# Patient Record
Sex: Female | Born: 1965 | Race: Black or African American | Hispanic: No | Marital: Single | Smoking: Never smoker
Health system: Southern US, Community
[De-identification: ages and names within clinical notes are randomized; demographics above are authoritative.]

## PROBLEM LIST (undated history)

## (undated) DIAGNOSIS — M1990 Unspecified osteoarthritis, unspecified site: Secondary | ICD-10-CM

## (undated) DIAGNOSIS — I10 Essential (primary) hypertension: Secondary | ICD-10-CM

## (undated) DIAGNOSIS — E785 Hyperlipidemia, unspecified: Secondary | ICD-10-CM

## (undated) HISTORY — DX: Morbid (severe) obesity due to excess calories: E66.01

## (undated) HISTORY — PX: GASTRIC BYPASS: SHX52

## (undated) HISTORY — DX: Hyperlipidemia, unspecified: E78.5

## (undated) HISTORY — PX: CHOLECYSTECTOMY: SHX55

## (undated) HISTORY — DX: Essential (primary) hypertension: I10

## (undated) HISTORY — PX: TUBAL LIGATION: SHX77

## (undated) HISTORY — PX: TOTAL ABDOMINAL HYSTERECTOMY: SHX209

## (undated) HISTORY — PX: TOTAL KNEE ARTHROPLASTY: SHX125

## (undated) HISTORY — DX: Unspecified osteoarthritis, unspecified site: M19.90

---

## 2003-05-25 ENCOUNTER — Encounter: Payer: Self-pay | Admitting: Internal Medicine

## 2003-05-25 LAB — CONVERTED CEMR LAB

## 2005-06-08 LAB — HM MAMMOGRAPHY: HM Mammogram: NORMAL

## 2009-03-28 ENCOUNTER — Encounter: Payer: Self-pay | Admitting: Internal Medicine

## 2009-03-28 ENCOUNTER — Ambulatory Visit: Payer: Self-pay | Admitting: Internal Medicine

## 2009-03-28 DIAGNOSIS — K219 Gastro-esophageal reflux disease without esophagitis: Secondary | ICD-10-CM

## 2009-03-28 DIAGNOSIS — E785 Hyperlipidemia, unspecified: Secondary | ICD-10-CM | POA: Insufficient documentation

## 2009-03-28 DIAGNOSIS — M25519 Pain in unspecified shoulder: Secondary | ICD-10-CM

## 2009-03-28 DIAGNOSIS — R7309 Other abnormal glucose: Secondary | ICD-10-CM | POA: Insufficient documentation

## 2009-03-28 DIAGNOSIS — E1169 Type 2 diabetes mellitus with other specified complication: Secondary | ICD-10-CM | POA: Insufficient documentation

## 2009-03-28 DIAGNOSIS — I10 Essential (primary) hypertension: Secondary | ICD-10-CM | POA: Insufficient documentation

## 2009-03-28 LAB — CONVERTED CEMR LAB
AST: 19 units/L (ref 0–37)
Bilirubin, Direct: 0.1 mg/dL (ref 0.0–0.3)
Calcium: 9.8 mg/dL (ref 8.4–10.5)
Cholesterol: 375 mg/dL — ABNORMAL HIGH (ref 0–200)
Hgb A1c MFr Bld: 6.3 % — ABNORMAL HIGH (ref 4.6–6.1)
LDL Cholesterol: 257 mg/dL — ABNORMAL HIGH (ref 0–99)
Potassium: 4.2 meq/L (ref 3.5–5.3)
Sodium: 143 meq/L (ref 135–145)
TSH: 1.206 microintl units/mL (ref 0.350–4.500)
Total CHOL/HDL Ratio: 5

## 2009-04-15 ENCOUNTER — Telehealth: Payer: Self-pay | Admitting: Internal Medicine

## 2009-04-15 ENCOUNTER — Ambulatory Visit: Payer: Self-pay | Admitting: Internal Medicine

## 2009-04-15 LAB — CONVERTED CEMR LAB
Bilirubin Urine: NEGATIVE
Cholesterol, target level: 200 mg/dL
Glucose, Urine, Semiquant: NEGATIVE
Protein, U semiquant: >=300
Specific Gravity, Urine: 1.02
Urobilinogen, UA: 0.2
WBC Urine, dipstick: NEGATIVE

## 2009-04-29 ENCOUNTER — Encounter: Payer: Self-pay | Admitting: Internal Medicine

## 2009-05-12 ENCOUNTER — Telehealth: Payer: Self-pay | Admitting: Internal Medicine

## 2009-05-30 ENCOUNTER — Ambulatory Visit: Payer: Self-pay | Admitting: Internal Medicine

## 2009-05-30 DIAGNOSIS — L0233 Carbuncle of buttock: Secondary | ICD-10-CM | POA: Insufficient documentation

## 2009-05-30 DIAGNOSIS — M25569 Pain in unspecified knee: Secondary | ICD-10-CM

## 2009-05-30 LAB — CONVERTED CEMR LAB
Bilirubin Urine: NEGATIVE
Blood in Urine, dipstick: NEGATIVE
Protein, U semiquant: 30
Specific Gravity, Urine: 1.01
pH: 7

## 2009-05-31 ENCOUNTER — Encounter: Payer: Self-pay | Admitting: Internal Medicine

## 2009-06-09 ENCOUNTER — Ambulatory Visit: Payer: Self-pay | Admitting: Internal Medicine

## 2009-06-09 LAB — CONVERTED CEMR LAB
Alkaline Phosphatase: 80 units/L (ref 39–117)
CO2: 28 meq/L (ref 19–32)
Chloride: 104 meq/L (ref 96–112)
Creatinine, Ser: 1.02 mg/dL (ref 0.40–1.20)
HDL: 69 mg/dL (ref >39)
LDL Cholesterol: 182 mg/dL — ABNORMAL HIGH (ref 0–99)
Potassium: 4.1 meq/L (ref 3.5–5.3)
Total Bilirubin: 0.4 mg/dL (ref 0.3–1.2)
Triglycerides: 303 mg/dL — ABNORMAL HIGH (ref <150)
VLDL: 61 mg/dL — ABNORMAL HIGH (ref 0–40)

## 2009-06-21 ENCOUNTER — Ambulatory Visit: Payer: Self-pay | Admitting: Internal Medicine

## 2009-07-13 ENCOUNTER — Telehealth: Payer: Self-pay | Admitting: Internal Medicine

## 2009-08-10 ENCOUNTER — Ambulatory Visit (HOSPITAL_BASED_OUTPATIENT_CLINIC_OR_DEPARTMENT_OTHER): Admission: RE | Admit: 2009-08-10 | Discharge: 2009-08-10 | Payer: Self-pay | Admitting: Internal Medicine

## 2009-08-10 ENCOUNTER — Ambulatory Visit: Payer: Self-pay | Admitting: Internal Medicine

## 2009-08-10 ENCOUNTER — Telehealth: Payer: Self-pay | Admitting: Internal Medicine

## 2009-08-10 ENCOUNTER — Ambulatory Visit: Payer: Self-pay | Admitting: Diagnostic Radiology

## 2009-08-10 DIAGNOSIS — N6459 Other signs and symptoms in breast: Secondary | ICD-10-CM

## 2009-08-10 DIAGNOSIS — R109 Unspecified abdominal pain: Secondary | ICD-10-CM

## 2009-08-10 DIAGNOSIS — IMO0002 Reserved for concepts with insufficient information to code with codable children: Secondary | ICD-10-CM | POA: Insufficient documentation

## 2009-08-10 DIAGNOSIS — L989 Disorder of the skin and subcutaneous tissue, unspecified: Secondary | ICD-10-CM | POA: Insufficient documentation

## 2009-08-10 LAB — CONVERTED CEMR LAB
Bilirubin Urine: NEGATIVE
Glucose, Urine, Semiquant: NEGATIVE
Ketones, urine, test strip: NEGATIVE
Nitrite: NEGATIVE
Protein, U semiquant: 100
Specific Gravity, Urine: 1.02
Urobilinogen, UA: 0.2

## 2009-08-12 ENCOUNTER — Encounter: Payer: Self-pay | Admitting: Internal Medicine

## 2009-08-25 ENCOUNTER — Telehealth: Payer: Self-pay | Admitting: Internal Medicine

## 2009-08-29 ENCOUNTER — Encounter: Payer: Self-pay | Admitting: Internal Medicine

## 2009-09-14 ENCOUNTER — Telehealth: Payer: Self-pay | Admitting: Internal Medicine

## 2009-09-28 ENCOUNTER — Ambulatory Visit: Payer: Self-pay | Admitting: Internal Medicine

## 2009-09-28 DIAGNOSIS — R209 Unspecified disturbances of skin sensation: Secondary | ICD-10-CM | POA: Insufficient documentation

## 2009-09-30 ENCOUNTER — Encounter: Payer: Self-pay | Admitting: Internal Medicine

## 2009-10-24 ENCOUNTER — Ambulatory Visit: Payer: Self-pay | Admitting: Internal Medicine

## 2010-01-16 ENCOUNTER — Telehealth: Payer: Self-pay | Admitting: Internal Medicine

## 2010-01-19 ENCOUNTER — Ambulatory Visit: Payer: Self-pay | Admitting: Internal Medicine

## 2010-01-23 ENCOUNTER — Ambulatory Visit (HOSPITAL_BASED_OUTPATIENT_CLINIC_OR_DEPARTMENT_OTHER)
Admission: RE | Admit: 2010-01-23 | Discharge: 2010-01-23 | Payer: Self-pay | Source: Home / Self Care | Attending: Family Medicine | Admitting: Family Medicine

## 2010-02-22 ENCOUNTER — Ambulatory Visit: Admit: 2010-02-22 | Payer: Self-pay | Admitting: Internal Medicine

## 2010-03-14 NOTE — Progress Notes (Signed)
Summary: Medication Refill  Phone Note Refill Request Message from:  Fax from Pharmacy on May 12, 2009 8:22 AM  Refills Requested: Medication #1:  AMLODIPINE BESYLATE 5 MG TABS one by mouth once daily   Dosage confirmed as above?Dosage Confirmed   Brand Name Necessary? No   Supply Requested: 1 month   Last Refilled: 03/28/2009  Medication #2:  LISINOPRIL-HYDROCHLOROTHIAZIDE 20-12.5 MG TABS one by mouth once daily   Dosage confirmed as above?Dosage Confirmed   Brand Name Necessary? No   Supply Requested: 1 month   Last Refilled: 03/28/2009 [facility] [place]    Method Requested: Electronic Next Appointment Scheduled: 06-09-09 830 LAB  Initial call taken by: Roselle Locus,  May 12, 2009 8:26 AM  Follow-up for Phone Call        Rx completed in Dr. Tiajuana Amass Follow-up by: Glendell Docker CMA,  May 12, 2009 10:48 AM    Prescriptions: LISINOPRIL-HYDROCHLOROTHIAZIDE 20-12.5 MG TABS (LISINOPRIL-HYDROCHLOROTHIAZIDE) one by mouth once daily  #30 x 3   Entered by:   Glendell Docker CMA   Authorized by:   D. Thomos Lemons DO   Signed by:   Glendell Docker CMA on 05/12/2009   Method used:   Electronically to        [facility] [phone]* (retail)       [address]       [place], [place]  [address]       Ph: [phone]       Fax: [phone]   RxID:   [id number] AMLODIPINE BESYLATE 5 MG TABS (AMLODIPINE BESYLATE) one by mouth once daily  #30 x 3   Entered by:   Glendell Docker CMA   Authorized by:   D. Thomos Lemons DO   Signed by:   Glendell Docker CMA on 05/12/2009   Method used:   Electronically to        [facility] [phone]* (retail)       [address]       [place], [place]  [address]       Ph: [phone]       Fax: [phone]   RxID:   [id number]

## 2010-03-14 NOTE — Assessment & Plan Note (Signed)
Summary: poss UTI/dt   Vital Signs:  Patient profile:   45 year old [name] Height:      60 inches Weight:      313.50 pounds BMI:     61.45 O2 Sat:      96 % on Room air Temp:     97.7 degrees F oral Pulse rate:   77 / minute Pulse rhythm:   regular Resp:     20 per minute BP sitting:   140 / 100  (right arm) Cuff size:   Thigh  Vitals Entered By: Glendell Docker CMA (May 30, 2009 10:39 AM)  O2 Flow:  Room air CC: Rm 3-  Urinary discomfort & Knee pain Comments s   Primary Care Provider:  Dondra Spry DO  CC:  Rm 3-  Urinary discomfort & Knee pain.  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   Lungs:  normal respiratory effort, normal breath sounds, no crackles, and no wheezes.   Heart:  normal rate, regular rhythm, and no gallop.   Abdomen:  soft and non-tender.  no flank tenderness Extremities:  No lower extremity edema  Skin:  subcentimeter carbuncles - perineal area,  no drainage,  no redness   Impression & Recommendations:  Problem # 1:  CARBUNCLE, BUTTOCK (ICD-680.5) small carbuncles perineal area.  no abscess.  tx with doxycycline.   Patient advised to call office if symptoms persist or worsen.  Problem # 2:  KNEE PAIN (ICD-719.46) pt with intermittent knee pain.  probable DJD.  use tramadol for severe symptoms Her updated medication list for this problem includes:    Tramadol Hcl 50 Mg Tabs (Tramadol hcl) ..... One by mouth once daily as needed for knee pain  Complete Medication List: 1)  Amlodipine Besylate 5 Mg Tabs (Amlodipine besylate) .... One by mouth once daily 2)  Lisinopril-hydrochlorothiazide 20-12.5 Mg Tabs (Lisinopril-hydrochlorothiazide) .... One by mouth once daily 3)  Simvastatin 40 Mg Tabs (Simvastatin) .... One by mouth qpm 4)  Metformin Hcl 500 Mg Tabs (Metformin hcl) .... 1/2 by mouth two times a day x 1 week, then one by mouth two times a day 5)  Clotrimazole-betamethasone 1-0.05 % Crea (Clotrimazole-betamethasone) .... Apply two times a day x 1 week 6)  Doxycycline Hyclate 100 Mg Tabs (Doxycycline hyclate) .... One by mouth two times a day 7)  Tramadol Hcl 50 Mg Tabs (Tramadol hcl) .... One by mouth once daily as needed for knee pain  Other Orders: T-Culture, Urine ([id number]) UA Dipstick w/o Micro (manual) (25852) Specimen Handling (99000)  Patient Instructions: 1)  Keep your next follow up appointment 2)  Call our office if your urinary symptoms  do not  improve or gets worse. Prescriptions: TRAMADOL HCL 50 MG TABS (TRAMADOL HCL) one by mouth once daily as needed for knee pain  #30 x 0   Entered and Authorized by:   D. Thomos Lemons DO   Signed by:   D. Thomos Lemons DO on 05/30/2009   Method used:   Electronically to        [facility] [phone]* (retail)       [address]       [place], [place]  [address]       Ph: [phone]       Fax: [phone]   RxID:   [id number] DOXYCYCLINE HYCLATE 100 MG TABS (DOXYCYCLINE HYCLATE) one by mouth two times a day  #14 x 0   Entered and Authorized by:   D. Thomos Lemons DO   Signed by:   D. Thomos Lemons DO on 05/30/2009   Method used:    Electronically to        [facility] [phone]* (retail)       [address]       [place], [place]  [address]       Ph: [phone]       Fax: [phone]   RxID:   [id number] CLOTRIMAZOLE-BETAMETHASONE 1-0.05 % CREA (CLOTRIMAZOLE-BETAMETHASONE) apply two times a day x 1 week  #30 grams x 0   Entered and Authorized by:   D. Thomos Lemons DO   Signed by:   D. Thomos Lemons DO on 05/30/2009   Method used:   Electronically to        [facility] [phone]* (retail)       [address]       [place], [place]  [address]       Ph: [phone]       Fax: [phone]   RxID:   [id number]   Current Allergies (reviewed today): No known allergies   Laboratory Results   Urine Tests    Routine Urinalysis   Color: yellow Appearance: Clear Glucose: negative   (Normal Range: Negative) Bilirubin: negative   (Normal Range: Negative) Ketone: negative   (Normal Range: Negative) Spec. Gravity: 1.010   (Normal Range: 1.003-1.035) Blood: negative   (Normal Range: Negative) pH: 7.0   (Normal Range: 5.0-8.0) Protein: 30   (Normal Range: Negative) Urobilinogen: 0.2   (Normal Range: 0-1) Nitrite: negative   (Normal Range: Negative) Leukocyte Esterace: negative   (Normal Range: Negative)

## 2010-03-14 NOTE — Assessment & Plan Note (Signed)
Summary: 2 month follow up/mhf   Vital Signs:  Patient profile:   45 year old female Height:      60 inches Weight:      307.75 pounds BMI:     60.32 O2 Sat:      98 % Temp:     97.8 degrees F oral Pulse rate:   93 / minute Pulse rhythm:   regular Resp:     16 per minute BP sitting:   130 / 98  (left arm) Cuff size:   thigh  Vitals Entered By: Mervin Kung CMA (Jun 21, 2009 9:57 AM) CC: Room 2    2 Month follow up  Is Patient Diabetic? Yes   Primary Care Provider:  Dondra Spry DO  CC:  Room 2    2 Month follow up .  History of Present Illness: 45 y/o AA female c/o diarrhea. loose BMs since this Thursday.   4 BMs per day. stools can be watery no blood in stool  bumps in groin / legs getting worse no drainage  Htn - she did not take her bp meds this AM  morbid obesity - she has made signficant dietary changes.    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   Lungs:  normal respiratory effort, normal breath sounds, no crackles, and no wheezes.   Heart:  normal rate, regular rhythm, and no gallop.   Abdomen:  soft, non-tender, and normal bowel sounds.   Extremities:  No lower extremity edema  Skin:  no rashes,  faint patches on back of legs.  no boils Psych:  normally interactive and good eye contact.     Impression & Recommendations:  Problem # 1:  DIABETES MELLITUS, TYPE II, BORDERLINE  (ICD-790.29) I suspect loose stools from metformin.  change to Xr formulation.  pt also advised to take medication with food  The following medications were removed from the medication list:    Metformin Hcl 500 Mg Tabs (Metformin hcl) ..... One by mouth two times a day Her updated medication list for this problem includes:    Metformin Hcl 500 Mg Xr24h-tab (Metformin hcl) ..... One by mouth two times a day  Future Orders: T- Hemoglobin A1C ([id number]) ... 10/18/2009  Problem # 2:  HYPERTENSION (ICD-401.9) she did not take her meds this AM.  compliance encouraged.  Her updated medication list for this problem includes:    Amlodipine Besylate 5 Mg Tabs (Amlodipine besylate) ..... One by mouth once daily    Lisinopril-hydrochlorothiazide 20-12.5 Mg Tabs (Lisinopril-hydrochlorothiazide) ..... One by mouth once daily  Future Orders: T-Basic Metabolic Panel [id number]) ... 10/18/2009  BP today: 130/98 Prior BP: 140/100 (05/30/2009)  Prior 10 Yr Risk Heart Disease: 8 % (04/15/2009)  Labs Reviewed: K+: 4.1 (06/09/2009) Creat: : 1.02 (06/09/2009)   Chol: 312 (06/09/2009)   HDL: 69 (06/09/2009)   LDL: 182 (06/09/2009)   TG: 303 (06/09/2009)  Complete Medication List: 1)  Amlodipine Besylate 5 Mg Tabs (Amlodipine besylate) .... One by mouth once daily 2)  Lisinopril-hydrochlorothiazide 20-12.5 Mg Tabs (Lisinopril-hydrochlorothiazide) .... One by mouth once daily 3)  Simvastatin 40 Mg Tabs (Simvastatin) .... One by mouth qpm 4)  Clotrimazole-betamethasone 1-0.05 % Crea (Clotrimazole-betamethasone) .... Apply two times a day x 1 week 5)  Tramadol Hcl 50 Mg Tabs (Tramadol hcl) .... One by mouth once daily as needed for knee pain 6)  Metformin Hcl 500 Mg Xr24h-tab (Metformin hcl) .... One by mouth two times a day  Other Orders: Future Orders: T-Lipid Profile ([id number]) ... 10/18/2009  Patient Instructions: 1)  Please schedule a follow-up appointment in 4 months. 2)  BMP prior to  visit, ICD-9: 401.9 3)  AST, ALT Lipid Panel prior to visit, ICD-9: 272.4 4)  HbgA1C prior to visit, ICD-9: 790.29 5)  Please return for lab work one (1) week before your next appointment.  Prescriptions: SIMVASTATIN 40 MG TABS (SIMVASTATIN) one by mouth qpm  #30 x 5   Entered and Authorized by:   D. Thomos Lemons DO   Signed by:   D. Thomos Lemons DO on 06/21/2009   Method used:   Electronically to        [facility] [phone]* (retail)       [address]       [place], [place]  [address]       Ph: [phone]       Fax: [phone]   RxID:   [id number] LISINOPRIL-HYDROCHLOROTHIAZIDE 20-12.5 MG TABS (LISINOPRIL-HYDROCHLOROTHIAZIDE) one by mouth once daily  #30 x 5   Entered and Authorized by:   D. Thomos Lemons DO   Signed by:   D. Thomos Lemons DO on 06/21/2009   Method used:   Electronically to        [facility] [phone]* (retail)       [address]       [place], [place]  [address]       Ph: [phone]       Fax: [phone]   RxID:   [id number] AMLODIPINE BESYLATE 5 MG TABS (AMLODIPINE BESYLATE) one by mouth once daily  #30 x 5   Entered and Authorized by:   D. Thomos Lemons DO   Signed by:   D. Thomos Lemons DO on 06/21/2009   Method used:   Electronically to        [facility] [phone]* (retail)       [address]       [place], [place]  [address]       Ph: [phone]       Fax: [phone]   RxID:   [id number] METFORMIN HCL 500 MG XR24H-TAB (METFORMIN HCL) one by mouth two times a day  #60 x 5   Entered and Authorized by:   D. Thomos Lemons DO   Signed by:   D. Thomos Lemons DO on 06/21/2009   Method used:   Electronically to        [facility] [phone]* (retail)       [address]       [place], [place]  [address]       Ph: [phone]       Fax: [phone]   RxID:   [id number]   Current Allergies (reviewed today): No known allergies

## 2010-03-14 NOTE — Progress Notes (Signed)
Summary: Tramadol Refill  Phone Note Call from Patient Call back at Home Phone [phone]   Caller: Patient Summary of Call: patient called and left voice message requesting a refill on Tramadol. Initial call taken by: Glendell Docker CMA,  July 13, 2009 2:12 PM  Follow-up for Phone Call        call was returned to patient at [phone], patient states she is having pain in her legs and her knee. She states she has arthritis and it is being aggravated by the constant going up and down stairs in her home.  She states she has been laying down all day. She was informed that Dr Artist Pais is out of the office this afternoon and I would check with nurse practicioner, dependent upon what she decides, patient was informed she may have to wait until Dr Artist Pais returns in morning. Patient verbalized understanding and agrees. Follow-up by: Glendell Docker CMA,  July 13, 2009 2:16 PM  Additional Follow-up for Phone Call Additional follow up Details #1::        Refill sent to pharmacy. Additional Follow-up by: Lemont Fillers FNP,  July 13, 2009 2:22 PM    Additional Follow-up for Phone Call Additional follow up Details #2::    patient advised rx sent to pharmacy Follow-up by: Glendell Docker CMA,  July 13, 2009 2:35 PM  Prescriptions: TRAMADOL HCL 50 MG TABS (TRAMADOL HCL) one by mouth once daily as needed for knee pain  #30 x 0   Entered by:   Lemont Fillers FNP   Authorized by:   D. Thomos Lemons DO   Signed by:   Lemont Fillers FNP on 07/13/2009   Method used:   Electronically to        [facility] [phone]* (retail)       [address]       [place], [place]  [address]       Ph: [phone]       Fax: [phone]   RxID:   [id number]

## 2010-03-14 NOTE — Assessment & Plan Note (Signed)
Summary: New Patient   Vital Signs:  Patient profile:   45 year old female Height:      60 inches Weight:      315.75 pounds BMI:     61.89 O2 Sat:      99 % on Room air Temp:     98.1 degrees F oral Pulse rate:   92 / minute Pulse rhythm:   regular Resp:     18 per minute BP sitting:   160 / 100  Vitals Entered By: Glendell Docker CMA (March 28, 2009 9:46 AM)  O2 Flow:  Room air    Contraindications/Deferment of Procedures/Staging:    Test/Procedure: PAP Smear    Reason for deferment: hysterectomy   Primary Care Provider:  Dondra Spry DO  CC:  New Patient.  History of Present Illness: New patient  Blood pressure -out of blood pressure medication for the past  2months right arm, shoulder and neck pain for the past 3 weeks discuss wieght concerns and exercise-increase in wt after hysterectomy  htn - for five years.  Dr. Milus Banister prescribed BP meds (GYN).  last took BP meds 2 months ago.  when taking BP meds - no side effects when not taking BP meds - SBP can rise to 200.  assoc with chest pain and headache no hx of heart dz. never been hospitalized for chest pain.  hyperlipidemia - dx 5 yrs ago.  took lipitor in the past. no issues with statin  denies diabetes but told blood sugars are borderline  pt c/o right shoulder pain.  discomfort with lifting  right arm above shoulder.  onset 3 weeks.  no injury or trauma.    Preventive Screening-Counseling & Management  Alcohol-Tobacco     Alcohol drinks/day: 0     Smoking Status: never  Caffeine-Diet-Exercise     Caffeine use/day: none     Does Patient Exercise: no      Drug Use:  no.    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   Head:  normocephalic and atraumatic.   Eyes:  pupils equal, pupils round, and pupils reactive to light.   Ears:  R ear normal and L ear normal.   Mouth:  pharynx pink and moist.   Neck:  thick neck, supple and no masses.   Lungs:  normal respiratory effort, normal breath sounds, no crackles, and no wheezes.   Heart:  normal rate, regular rhythm, and no gallop.   Abdomen:  obese,  mild epigastric and RUQ tenderness,  unable to appreciate organomegaly or mass Msk:  right shoulder - AC joint tenderness,  limited abduction to 90 degrees Extremities:  trace left pedal edema and trace right pedal edema.   Neurologic:  cranial nerves II-XII intact and gait normal.     Impression & Recommendations:  Problem # 1:  HYPERTENSION (ICD-401.9)  Pt hasn't taken her BP meds x 2 months.  we discussed risk of untreated htn.  restart meds.  Pt will likely need 3-4 meds.  The following medications were removed from the medication list:    Hydrochlorothiazide 12.5 Mg Caps (Hydrochlorothiazide) .Marland Kitchen... Take 1 tablet by mouth every morning    Lisinopril 20 Mg Tabs (Lisinopril) .Marland Kitchen... Take 1 tablet by mouth once a day Her updated medication list for this problem includes:    Lopressor 50 Mg Tabs (Metoprolol tartrate) .Marland Kitchen... Take 1 tablet by mouth once a day    Amlodipine Besylate 5 Mg Tabs (Amlodipine besylate) ..... One by mouth once daily    Lisinopril-hydrochlorothiazide 20-12.5 Mg Tabs (Lisinopril-hydrochlorothiazide) ..... One by mouth once daily  Orders: T-Basic Metabolic Panel [id number]) T-TSH [id number])  The  following medications were removed from the medication list:    Hydrochlorothiazide 12.5 Mg Caps (Hydrochlorothiazide) .Marland Kitchen... Take 1 tablet by mouth every morning    Lisinopril 20 Mg Tabs (Lisinopril) .Marland Kitchen... Take 1 tablet by mouth once a day Her updated medication list for this problem includes:    Lopressor 50 Mg Tabs (Metoprolol tartrate) .Marland Kitchen... Take 1 tablet by mouth once a day    Amlodipine Besylate 5 Mg Tabs (Amlodipine besylate) ..... One by mouth once daily    Lisinopril-hydrochlorothiazide 20-12.5 Mg Tabs (Lisinopril-hydrochlorothiazide) ..... One by mouth once daily  BP today: 160/100  Problem # 2:  DIABETES MELLITUS, TYPE II, BORDERLINE (ICD-790.29) we discussed dietary restrictions.  check A1c  Orders: T- Hemoglobin A1C ([id number])  Problem # 3:  SHOULDER PAIN, RIGHT (ICD-719.41) right shoulder pain.  I suspect rotator cuff tendinitis.  Orders:  Orthopedic Referral (Ortho)  Problem # 4:  GERD (ICD-530.81) Assessment: Comment Only GERD exacerbated by nsaid use.  PPI for 6-8 wks.  Her updated medication list for this problem includes:    Omeprazole 20 Mg Cpdr (Omeprazole) ..... One by mouth once daily  Complete Medication List: 1)  Lopressor 50 Mg Tabs (Metoprolol tartrate) .... Take 1 tablet by mouth once a day 2)  Amlodipine Besylate 5 Mg Tabs (Amlodipine besylate) .... One by mouth once daily 3)  Lisinopril-hydrochlorothiazide 20-12.5 Mg Tabs (Lisinopril-hydrochlorothiazide) .... One by mouth once daily 4)  Omeprazole 20 Mg Cpdr (Omeprazole) .... One by mouth once daily  Other Orders: T-Hepatic Function [id number]) T-Lipid Profile [id number]) EKG w/ Interpretation ([address])  Patient Instructions: 1)  Please schedule a follow-up appointment in 2 weeks. 2)  Please follow low salt diet. 3)  Do not take over the counter advil, aleve, or motrin on a regular basis Prescriptions: OMEPRAZOLE 20 MG CPDR (OMEPRAZOLE) one by mouth once daily  #30 x 1   Entered and  Authorized by:   D. Thomos Lemons DO   Signed by:   D. Thomos Lemons DO on 03/28/2009   Method used:   Electronically to        [facility] [phone]* (retail)       [address]       [place], [place]  [address]       Ph: [phone]       Fax: [phone]   RxID:   [id number] AMLODIPINE BESYLATE 5 MG TABS (AMLODIPINE BESYLATE) one by mouth once daily  #30 x 0   Entered and Authorized by:   D. Thomos Lemons DO   Signed by:   D. Thomos Lemons DO on 03/28/2009   Method used:   Electronically to  [facility] [address] [phone]* (retail)       [address]       [place], [place]  [address]       Ph: [phone]       Fax: [phone]   RxID:   [id number] LISINOPRIL-HYDROCHLOROTHIAZIDE 20-12.5 MG TABS (LISINOPRIL-HYDROCHLOROTHIAZIDE) one by mouth once daily  #30 x 0   Entered and Authorized by:   D. Thomos Lemons DO   Signed by:   D. Thomos Lemons DO on 03/28/2009   Method used:   Electronically to        [facility] [phone]* (retail)       [address]       [place], [place]  [address]       Ph: [phone]       Fax: [phone]   RxID:   [id number]    Preventive Care Screening  Mammogram:    Date:  06/05/2005    Results:  normal   Pap Smear:    Date:  05/25/2003    Results:  Hysterectomy    Current Allergies (reviewed today): No known allergies

## 2010-03-14 NOTE — Assessment & Plan Note (Signed)
Summary: 1 month follow up/mhf   Vital Signs:  Patient profile:   45 year old female Weight:      312 pounds BMI:     61.15 O2 Sat:      99 % on Room air Temp:     98.0 degrees F oral Pulse rate:   84 / minute Pulse rhythm:   regular BP sitting:   154 / 110  (left arm) Cuff size:   Thigh  Vitals Entered By: Glendell Docker CMA (September 28, 2009 11:40 AM)  O2 Flow:  Room air CC: 1 Month Follow up  Pain Assessment Patient in pain? yes     Location: knee Intensity: 10 Type: stinging Onset of pain  With activity   Primary Care Provider:  Dondra Spry DO  CC:  1 Month Follow up .  History of Present Illness:  45 y/o AA female for f/u after prev visit - pt followed up with her GYN she was tx'ed with abx.  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   Lungs:  normal respiratory effort and normal breath sounds.   Heart:  normal rate, regular rhythm, and no gallop.   Extremities:  trace left pedal edema and trace right pedal edema.     Impression & Recommendations:  Problem # 1:  DYSPAREUNIA (ICD-625.0) Assessment Improved pt reports seeing the GYN pain attributed to bladder   infection and or vaginitis she was treated with flagyl  Problem # 2:  NIPPLE DISCHARGE (ICD-611.79) Assessment: Improved  resolved.   pt seen by her GYN - Dr.  Annye Rusk she reports breast exam normal I advise mammogram  Orders: Mammogram (Screening) (Mammo)  Problem # 3:  HYPERTENSION (ICD-401.9) Assessment: Deteriorated I urged compliance with BP meds.    The following medications were removed from the medication list:    Amlodipine Besylate 5 Mg Tabs (Amlodipine besylate) ..... One by mouth once daily Her updated medication list for this problem includes:    Lisinopril-hydrochlorothiazide 20-12.5 Mg Tabs (Lisinopril-hydrochlorothiazide) ..... One by mouth once daily  Future Orders: T-Basic Metabolic Panel [id number]) ... 11/21/2009  BP today: 154/110 Prior BP: 154/110 (08/10/2009)  Prior 10 Yr Risk Heart Disease: 8 % (04/15/2009)  Labs Reviewed: K+: 4.1 (06/09/2009) Creat: : 1.02 (06/09/2009)   Chol: 312 (06/09/2009)   HDL: 69 (06/09/2009)   LDL: 182 (06/09/2009)   TG: 303 (06/09/2009)  Problem # 4:  KNEE PAIN (ICD-719.46) Assessment: Unchanged Chronic OA of knees.  use tramadol as needed.  I encouraged wt loss  Her updated medication list for this problem includes:    Tramadol Hcl 50 Mg Tabs (  Tramadol hcl) ..... One by mouth once daily as needed for knee pain  Complete Medication List: 1)  Lisinopril-hydrochlorothiazide 20-12.5 Mg Tabs (Lisinopril-hydrochlorothiazide) .... One by mouth once daily 2)  Pravastatin Sodium 40 Mg Tabs (Pravastatin sodium) .... One by mouth once daily 3)  Tramadol Hcl 50 Mg Tabs (Tramadol hcl) .... One by mouth once daily as needed for knee pain 4)  Metformin Hcl 500 Mg Xr24h-tab (Metformin hcl) .... One by mouth two times a day  Other Orders: Future Orders: T-Hepatic Function [id number]) ... 11/21/2009 T-Lipid Profile [phone]) ... 11/21/2009 T- Hemoglobin A1C ([id number]) ... 11/21/2009 T-Syphilis Test (RPR)  [phone]) ... 11/21/2009  Patient Instructions: 1)  Please schedule a follow-up appointment in 2 months. 2)  BMP prior to visit, ICD-9:  401.9 3)  Hepatic Panel prior to visit, ICD-9: 272.4 4)  Lipid Panel prior to visit, ICD-9: 272.4 5)  HbgA1C prior to visit, ICD-9: 790.29 6)  RPR:  782.0 7)  Please return for lab work one (1) week before your next appointment.  Prescriptions: PRAVASTATIN SODIUM 40 MG TABS (PRAVASTATIN SODIUM) one by mouth once daily  #30 x 5   Entered and Authorized by:   D. Thomos Lemons DO   Signed by:   D. Thomos Lemons DO on 09/28/2009   Method used:   Electronically to        [facility] [phone]* (retail)       [address]       [place], [place]  [address]       Ph: [phone]       Fax: [phone]   RxID:   [id number]

## 2010-03-14 NOTE — Letter (Signed)
Summary: Optometrist Release   Imported By: Lanelle Bal 08/16/2009 13:57:26  _____________________________________________________________________  External Attachment:    Type:   Image     Comment:   External Document

## 2010-03-14 NOTE — Assessment & Plan Note (Signed)
Summary: pain in lower abd and back pain/mhf   Vital Signs:  Patient profile:   45 year old female Weight:      311 pounds BMI:     60.96 O2 Sat:      98 % on Room air Temp:     98.0 degrees F oral Pulse rate:   87 / minute Pulse rhythm:   regular Resp:     18 per minute BP sitting:   154 / 110  (right arm) Cuff size:   Thigh  Vitals Entered By: Glendell Docker CMA (August 10, 2009 10:15 AM)  O2 Flow:  Room air CC: Rm 3- Back Pain Is Patient Diabetic? No Pain Assessment Patient in pain? yes     Location: lower back Intensity: 9 Type: aching Onset of pain  With activity Comments medications reviewed no longer using the Clotrimzaole cream, c/o lower back pain, labial irritation onset two days ago, no self care measures taken, she also states that she is having ovary pain- sharp pain with intercourse, does not have a regular gyn    Primary Care Provider:  DThomos Lemons DO  CC:  Rm 3- Back Pain.  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   Neck:  supple and no masses.   Lungs:  normal respiratory effort and normal breath sounds.   Heart:  normal rate,  regular rhythm, and no gallop.   Abdomen:  obese.  mild RLQ tenderness,  soft, no guarding, no rigidity, and no rebound tenderness.   Extremities:  trace left pedal edema and trace right pedal edema.   Neurologic:  cranial nerves II-XII intact and gait normal.   Skin:  4-5  2mm papular lesion on bilateral nipples   Impression & Recommendations:  Problem # 1:  PELVIC  PAIN (ICD-789.09) RLQ pain.  rule out kidney stone.  check CT.  use tramadol as directed.  UA is positive.  empiric cipro  Her updated medication list for this problem includes:    Tramadol Hcl 50 Mg Tabs (Tramadol hcl) ..... One by mouth once daily as needed for knee pain  Orders: CT without Contrast (CT w/o contrast)  Problem # 2:  NIPPLE DISCHARGE (ICD-611.79) pt advised to f/u with GYN  Orders: Mammogram (Screening) (Mammo)  Problem # 3:  SKIN LESION (ICD-709.9)  Pt with papillar lesion on nipples bilaterally.   question  molluscum.  refer to Derm for further eval and tx  Orders: Dermatology Referral (Derma)  Problem # 4:  DYSPAREUNIA (ICD-625.0)  Orders: Gynecologic Referral (Gyn)  Complete Medication List: 1)  Amlodipine Besylate 5 Mg Tabs (Amlodipine besylate) .... One by mouth once daily 2)  Lisinopril-hydrochlorothiazide 20-12.5 Mg Tabs (Lisinopril-hydrochlorothiazide) .... One by mouth once daily 3)  Simvastatin 40 Mg Tabs (Simvastatin) .... One by mouth qpm 4)  Tramadol Hcl 50 Mg Tabs (Tramadol hcl) .... One by mouth once daily as needed for knee pain 5)  Metformin Hcl 500 Mg Xr24h-tab (Metformin hcl) .... One by mouth two times a day 6)  Ciprofloxacin Hcl 500 Mg Tabs (Ciprofloxacin hcl) .... One by mouth two times a day  Other Orders: UA Dipstick w/o Micro (manual) ([id number]) T-Culture, Urine ([id number]) Specimen Handling ([address]000)  Patient Instructions: 1)  Please schedule a follow-up appointment in 2 weeks. 2)  Call our office if your symptoms do not  improve or gets  worse. Prescriptions: CIPROFLOXACIN HCL 500 MG TABS (CIPROFLOXACIN HCL) one by mouth two times a day  #14 x 0   Entered and Authorized by:   D. Thomos Lemons DO   Signed by:   D. Thomos Lemons DO on 08/10/2009   Method used:   Electronically to        [facility] [phone]* (retail)       [address]       [place], [place]  [address]       Ph: [phone]       Fax: [phone]   RxID:   [id number]   Current Allergies (reviewed today): No known allergies   Laboratory Results   Urine Tests    Routine Urinalysis   Color: yellow Appearance: Cloudy Glucose: negative   (Normal Range: Negative) Bilirubin: negative   (Normal Range: Negative) Ketone: negative   (Normal Range: Negative) Spec. Gravity: 1.020   (Normal Range: 1.003-1.035) Blood: trace-intact   (Normal Range: Negative) pH: 7.0   (Normal Range: 5.0-8.0) Protein: 100   (Normal Range: Negative) Urobilinogen: 0.2   (Normal Range: 0-1) Nitrite: negative   (Normal Range: Negative) Leukocyte Esterace: large   (Normal Range: Negative)

## 2010-03-14 NOTE — Assessment & Plan Note (Signed)
Summary: FOLLOW UP/MHF   Vital Signs:  Patient profile:   45 year old female Weight:      315 pounds BMI:     61.74 O2 Sat:      99 % on Room air Temp:     97.9 degrees F oral Pulse rate:   94 / minute Pulse rhythm:   regular Resp:     20 per minute BP sitting:   124 / 60  (right arm) Cuff size:   Thigh Cuff  Vitals Entered By: Glendell Docker CMA (April 15, 2009 1:29 PM)  O2 Flow:  Room air CC: Lipid Management   Primary Care Provider:  Dondra Spry DO  CC:  Lipid Management.  History of Present Illness:  Hypertension Follow-Up      This is a 45 year old woman who presents for Hypertension follow-up.  The patient reports urinary frequency, but denies lightheadedness.  The patient denies the following associated symptoms: chest pain.  Compliance with medications (by patient report) has been near 100%.  The patient reports that dietary compliance has been fair.    we reviewed labs - A1c elevated  Lipids elevated  Lipid Management History:      Positive NCEP/ATP III risk factors include diabetes and hypertension.  Negative NCEP/ATP III risk factors include female age less than 16 years old, HDL cholesterol greater than 60, no family history for ischemic heart disease, non-tobacco-user status, no ASHD (atherosclerotic heart disease), no prior stroke/TIA, no peripheral vascular disease, and no history of aortic aneurysm.     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    Physical  Exam  General:  alert and overweight-appearing.   Neck:  supple and no masses.   Lungs:  normal respiratory effort, normal breath sounds, no crackles, and no wheezes.   Heart:  normal rate, regular rhythm, and no gallop.   Extremities:  trace left pedal edema and trace right pedal edema.   Neurologic:  cranial nerves II-XII intact and gait normal.   Psych:  normally interactive and good eye contact.     Impression & Recommendations:  Problem # 1:  DIABETES MELLITUS, TYPE II, BORDERLINE (ICD-790.29)  Discussed importance of life style changes.  refer to nutrionist.   start metformin.  Her updated medication list for this problem includes:    Metformin Hcl 500 Mg Tabs (Metformin hcl) .Marland Kitchen... 1/2 by mouth two times a day x 1 week, then one by mouth two times a day  Labs Reviewed: Creat: 1.12 (03/28/2009)     Orders: Nutrition Referral (Nutrition)  Problem # 2:  SHOULDER PAIN, RIGHT (ICD-719.41) Pt referred to [facility].  awaiting scheduling  Problem # 3:  HYPERTENSION (ICD-401.9) significantly improved.  change metoprolol to succinate  Her updated medication list for this problem includes:    Metoprolol Succinate 50 Mg Xr24h-tab (Metoprolol succinate) ..... One by mouth once daily    Amlodipine Besylate 5 Mg Tabs (Amlodipine besylate) ..... One by mouth once daily    Lisinopril-hydrochlorothiazide 20-12.5 Mg Tabs (Lisinopril-hydrochlorothiazide) .Marland KitchenMarland KitchenMarland KitchenMarland Kitchen  One by mouth once daily  BP today: 124/60 Prior BP: 160/100 (03/28/2009)  Labs Reviewed: K+: 4.2 (03/28/2009) Creat: : 1.12 (03/28/2009)   Chol: 375 (03/28/2009)   HDL: 75 (03/28/2009)   LDL: 257 (03/28/2009)   TG: 216 (03/28/2009)  Problem # 4:  HYPERLIPIDEMIA (ICD-272.4) Goal LDL < 100.  dietary changes reviewed.  start statin.  Her updated medication list for this problem includes:    Simvastatin 40 Mg Tabs (Simvastatin) ..... One by mouth qpm  Labs Reviewed: SGOT: 19 (03/28/2009)   SGPT: 21 (03/28/2009)   HDL:75  (03/28/2009)  LDL:257 (03/28/2009)  Chol:375 (03/28/2009)  Trig:216 (03/28/2009)  Complete Medication List: 1)  Metoprolol Succinate 50 Mg Xr24h-tab (Metoprolol succinate) .... One by mouth once daily 2)  Amlodipine Besylate 5 Mg Tabs (Amlodipine besylate) .... One by mouth once daily 3)  Lisinopril-hydrochlorothiazide 20-12.5 Mg Tabs (Lisinopril-hydrochlorothiazide) .... One by mouth once daily 4)  Omeprazole 20 Mg Cpdr (Omeprazole) .... One by mouth once daily 5)  Simvastatin 40 Mg Tabs (Simvastatin) .... One by mouth qpm 6)  Metformin Hcl 500 Mg Tabs (Metformin hcl) .... 1/2 by mouth two times a day x 1 week, then one by mouth two times a day  Other Orders: UA Dipstick w/o Micro (manual) ([id number])  Lipid Assessment/Plan:      Based on NCEP/ATP III, the patient's risk factor category is "history of diabetes".  The patient's lipid goals are as follows: Total cholesterol goal is 200; LDL cholesterol goal is 100; HDL cholesterol goal is 40; Triglyceride goal is 150.    Patient Instructions: 1)  Please schedule a follow-up appointment in 2 months. 2)  BMP prior to visit, ICD-9:  401.9 3)  Hepatic Panel prior to visit, ICD-9: 272.4 4)  Lipid Panel prior to visit, ICD-9: 272.4 5)  HbgA1C prior to visit, ICD-9: 790.29 6)  [email or web address] 7)  Please return for lab work one (1) week before your next appointment.  Prescriptions: METFORMIN HCL 500 MG TABS (METFORMIN HCL) 1/2 by mouth two times a day x 1 week, then one by mouth two times a day  #60 x 2   Entered and Authorized by:   D. Thomos Lemons DO   Signed by:   D. Thomos Lemons DO on 04/15/2009   Method used:   Electronically to        [facility] [phone]* (retail)       [address]       [place], [place]  [address]       Ph: [phone]       Fax: [phone]   RxID:   [id number] SIMVASTATIN 40 MG TABS (SIMVASTATIN) one by mouth qpm  #30 x 2   Entered and Authorized by:   D. Thomos Lemons DO   Signed by:    D. Thomos Lemons DO on 04/15/2009   Method used:   Electronically to        [facility] [phone]* (retail)       [address]       [place], [place]  [address]       Ph: [phone]       Fax: [phone]   RxID:   [id number] METOPROLOL SUCCINATE 50 MG XR24H-TAB (METOPROLOL SUCCINATE) one by mouth once daily  #30 x 5   Entered and Authorized by:   D. Thomos Lemons DO   Signed by:   D. Thomos Lemons DO on 04/15/2009   Method  used:   Electronically to        [facility] [phone]* (retail)       [address]       [place], [place]  [address]       Ph: [phone]       Fax: [phone]   RxID:   [id number]   Current Allergies (reviewed today): No known allergies   Laboratory Results   Urine Tests    Routine Urinalysis   Color: yellow Appearance: Clear Glucose: negative   (Normal Range: Negative) Bilirubin: negative   (Normal Range: Negative) Ketone: negative   (Normal Range: Negative) Spec. Gravity: 1.020   (Normal Range: 1.003-1.035) Blood: negative   (Normal Range: Negative) pH: 6.5   (Normal Range: 5.0-8.0) Protein: >=300   (Normal Range: Negative) Urobilinogen: 0.2   (Normal Range: 0-1) Nitrite: negative   (Normal Range: Negative) Leukocyte Esterace: negative   (Normal Range: Negative)

## 2010-03-14 NOTE — Progress Notes (Signed)
Summary: Tramadol Refill  Phone Note Refill Request Call back at Home Phone [phone] Message from:  Patient on September 14, 2009 2:45 PM  Refills Requested: Medication #1:  TRAMADOL HCL 50 MG TABS one by mouth once daily as needed for knee pain Pt is out of refills  Initial call taken by: Lannette Donath,  September 14, 2009 2:45 PM  Follow-up for Phone Call        call returned to patient at [phone], bad connection and call was dropped. Patient was call back, no answer, voice message reached. Patient was advised office visit is required for refill on pain medication. Message left for patient to schedule a follow up  Follow-up by: Glendell Docker CMA,  September 14, 2009 3:04 PM

## 2010-03-14 NOTE — Consult Note (Signed)
Summary: [facility]   Imported By: Lanelle Bal 10/14/2009 10:39:32  _____________________________________________________________________  External Attachment:    Type:   Image     Comment:   External Document

## 2010-03-14 NOTE — Letter (Signed)
Summary: [facility]  [facility]   Imported By: Maryln Gottron 09/20/2009 14:23:33  _____________________________________________________________________  External Attachment:    Type:   Image     Comment:   External Document

## 2010-03-14 NOTE — Progress Notes (Signed)
Summary: needs a call back while at pharmacy  Phone Note Call from Patient   Caller: Patient Summary of Call: omeprazole, lisinopril HCP-HCTZ, amlodipine, is what pt. is use to taking & states she at the pharmacy picking this up now..... Patient needs a call back now while she is at the pharmacy because there is another med that was called in for her that she states she normally does not take, so she needs to know if she is to get this and take it?              call [phone] as soon as possible Initial call taken by: Michaelle Copas,  April 15, 2009 3:52 PM  Follow-up for Phone Call        call was returned to patient she states the pharmacy gave her 2 rx's and she was only to have had one rx.  She was advised to check with pharmacy. Clarification of Metformin was provided to patient. She state she will check with pharmacy Follow-up by: Glendell Docker CMA,  April 15, 2009 4:48 PM

## 2010-03-14 NOTE — Letter (Signed)
Summary: [facility]  [facility]   Imported By: Lanelle Bal 10/14/2009 09:23:50  _____________________________________________________________________  External Attachment:    Type:   Image     Comment:   External Document

## 2010-03-14 NOTE — Consult Note (Signed)
[facility]  [facility]   Imported By: Lanelle Bal 05/06/2009 09:57:18  _____________________________________________________________________  External Attachment:    Type:   Image     Comment:   External Document

## 2010-03-14 NOTE — Progress Notes (Signed)
Summary: GYN Status  ---- Converted from flag ---- ---- 08/24/2009 11:13 PM, D. Thomos Lemons DO wrote: call pt - has she been seen by GYN? ------------------------------  Phone Note Outgoing Call   Call placed by: Glendell Docker CMA,  August 25, 2009 8:07 AM Call placed to: Patient Summary of Call: call placed to patient at [phone], no answer. A voice message was left for patient to return call. Need to find out if patient was seen by GYN and if so when. Initial call taken by: Glendell Docker CMA,  August 25, 2009 8:08 AM  Follow-up for Phone Call        call was returned by patient, she states that she was seen by a GYN in [facility] on June 14th, but she could not recall the name at time of converstation Follow-up by: Glendell Docker CMA,  August 25, 2009 8:11 AM

## 2010-03-14 NOTE — Progress Notes (Signed)
Summary: Tramadol Refill  Phone Note Call from Patient Call back at [phone]   Caller: Patient Call For: D. Thomos Lemons DO Summary of Call: patient called and left voice message requesting a rx refill on Tramadol Initial call taken by: Glendell Docker CMA,  January 16, 2010 3:37 PM  Follow-up for Phone Call        call returned to patient regarding Tramadol refill, patient states that she is  having pain in her legs and pain with walking while she is on her job Follow-up by: Glendell Docker CMA,  January 16, 2010 4:15 PM  Additional Follow-up for Phone Call Additional follow up Details #1::        I suggest referral to ortho - see orders Additional Follow-up by: D. Thomos Lemons DO,  January 16, 2010 4:58 PM    Additional Follow-up for Phone Call Additional follow up Details #2::    call placed to patient, spoke with patients daughter Faith [name], message was left for patient to return call regarding refill and referral. Follow-up by: Glendell Docker CMA,  January 16, 2010 5:09 PM  Additional Follow-up for Phone Call Additional follow up Details #3:: Details for Additional Follow-up Action Taken: Call placed to pt. Left message with pt's daughter, Faith [name] to have pt return my call Faith [name] CMA Duncan Dull)  January 17, 2010 10:55 AM     no return call from patient Glendell Docker [facility]  January 18, 2010 8:30 AM

## 2010-03-14 NOTE — Progress Notes (Signed)
Summary: Ct Results  Phone Note Outgoing Call   Summary of Call: call pt - CT of abd and pelvis negative for acute findings Initial call taken by: D. Thomos Lemons DO,  August 10, 2009 4:42 PM  Follow-up for Phone Call        attempted to contact patient at [phone], no answer,detailed voice message left informing patient per Dr Artist Pais instructions Follow-up by: Glendell Docker CMA,  August 11, 2009 8:03 AM

## 2010-03-16 NOTE — Assessment & Plan Note (Signed)
Summary: KNEE PAIN/NP/LP   Vital Signs:  Patient profile:   45 year old female Height:      60 inches (152.40 cm) Weight:      307.4 pounds (139.73 kg) BMI:     60.25 Temp:     97.8 degrees F (36.56 degrees C) oral Pulse rate:   85 / minute BP sitting:   199 / 128  (right arm)  Vitals Entered By: Baxter Hire) (January 23, 2010 3:09 PM) CC: knee pain Pain Assessment Patient in pain? yes     Location: both knees Intensity: 10+ Type: aching/throbbing Onset of pain  Constant Nutritional Status BMI of > 30 = obese  Does patient need assistance? Functional Status Self care Ambulation Normal   Primary Care Provider:  DThomos Lemons DO  CC:  knee pain.  History of Present Illness: 45 yo F here with bilateral knee pain  Patient denies any known injury Has had several year history of L > R knee pain worse with ambulation Pain now even bad when sitting, soaking in tub, otherwise at rest. Remotely had x-rays showing 'bone on bone' arthritis. Has also had injections (cortisone and visco) which helped initially but when had them most recently > 1 year ago only lasted about a week. Tried aleve, tylenol, ibuprofen without help Icing and heat at bedtime. Taking tramadol now for pain Some catching and does give out because of pain No prior knee surgeries  Just took her blood pressure medication prior to coming into our office - no chest pain, dyspnea, other complaints.  Habits & Providers  Alcohol-Tobacco-Diet     Alcohol drinks/day: 0     Tobacco Status: never  Problems Prior to Update: 1)  Paresthesia  (ICD-782.0) 2)  Dyspareunia  (ICD-625.0) 3)  Skin Lesion  (ICD-709.9) 4)  Pelvic Pain  (ICD-789.09) 5)  Nipple Discharge  (ICD-611.79) 6)  Knee Pain  (ICD-719.46) 7)  Carbuncle, Buttock  (ICD-680.5) 8)  Gerd  (ICD-530.81) 9)  Shoulder Pain, Right  (ICD-719.41) 10)  Diabetes Mellitus, Type II, Borderline  (ICD-790.29) 11)  Hypertension  (ICD-401.9) 12)   Hyperlipidemia  (ICD-272.4)  Medications Prior to Update: 1)  Lisinopril-Hydrochlorothiazide 20-12.5 Mg Tabs (Lisinopril-Hydrochlorothiazide) .... One By Mouth Once Daily 2)  Pravastatin Sodium 40 Mg Tabs (Pravastatin Sodium) .... One By Mouth Once Daily 3)  Tramadol Hcl 50 Mg Tabs (Tramadol Hcl) .... One By Mouth Once Daily As Needed For Knee Pain 4)  Metformin Hcl 500 Mg Xr24h-Tab (Metformin Hcl) .... One By Mouth Two Times A Day  Allergies (verified): No Known Drug Allergies  Past History:  Past Medical History: Last updated: 09/28/2009 Hyperlipidemia Hypertension   Morbid Obesity    chronic osteoarthritis of bilateral knees    Family History: Last updated: 09/28/2009 Family History Hypertension estranged from father mother died in 34 at age 71 from brain aneurysm        Risk Factors: Alcohol Use: 0 (01/23/2010) Caffeine Use: none (03/28/2009) Exercise: no (03/28/2009)  Physical Exam  General:  alert and obese Msk:  R knee: + soft tissue swelling but no obvious effusion (difficult to discern).  No warmth, redness. TTP medial > lateral joint lines.  TTP with firmness at pes insertion as well. ROM 0-90 degrees with pain on full flexion Negative ant/post drawers. Stable to valgus/varus stress Negative Mcmurrays NVI distally.  L knee: + soft tissue swelling but no obvious effusion (difficult to discern).  No warmth, redness. TTP medial > lateral joint lines.  Mild TTP  at pes insertion ROM 0-90 degrees with pain on full flexion Negative ant/post drawers. Stable to valgus/varus stress Negative Mcmurrays NVI distally.   Impression & Recommendations:  Problem # 1:  KNEE PAIN (ICD-719.46) Assessment Deteriorated X-rays confirm severe L > R medial compartment DJD, minimal lateral and mod patellofemoral DJD - also noted medial shift of femoral component of knee relative to tibia.  My concern is that she has tried both cortisone injections and viscosupplementation  approximately a year ago and only had about 1 week (possibly up to 1 month) of relief with these.  Prior orthopedic surgeon did not want to pursue knee replacement stating she was too young.  We decided to try other conservative methods for pain relief - glucosamine daily, tramadol and capsaicin up to three times a day, quad strengthening home exercises.  Given her lack of relief with injections, I would recommend she see a different orthopedic surgeon to discuss knee replacement surgery.  While she is relatively young, her quality of life is very poor because of her knees (left especially) and she is running out of options.  See instructions for further.  Her updated medication list for this problem includes:    Tramadol Hcl 50 Mg Tabs (Tramadol hcl) ..... One tab by mouth three times a day as needed pain  Orders: Diagnostic X-Ray/Fluoroscopy (Diagnostic X-Ray/Flu) Diagnostic X-Ray/Fluoroscopy (Diagnostic X-Ray/Flu)  Complete Medication List: 1)  Lisinopril-hydrochlorothiazide 20-12.5 Mg Tabs (Lisinopril-hydrochlorothiazide) .... One by mouth once daily 2)  Pravastatin Sodium 40 Mg Tabs (Pravastatin sodium) .... One by mouth once daily 3)  Tramadol Hcl 50 Mg Tabs (Tramadol hcl) .... One tab by mouth three times a day as needed pain 4)  Metformin Hcl 500 Mg Xr24h-tab (Metformin hcl) .... One by mouth two times a day 5)  Capsaicin Apr 0.075 % Crea (Capsaicin) .... Apply to affected areas three times a day as needed pain  Patient Instructions: 1)  But Glucosamine sulfate over the counter - take 1500mg  every day. 2)  Take tramadol as needed three times a day for pain 3)  Use capsaicin cream over bilateral knees three times a day for pain. 4)  Ok to take tylenol with these if you want to - it works in combination with tramadol to increase its effectiveness. 5)  If these are not working enough for you, call me and we can refer you to a surgeon to discuss knee replacement - hopefully you get good  enough relief with the above though that we can put this off. 6)  It's important that you continue to stay active. 7)  Do 3 sets of 8 straight leg raises every day. 8)  Heat or ice as needed to help with pain. 9)  Water aerobics, cycling with low resistance, elliptical are best activities for your knees. 10)  Follow up with me in 1 month for a recheck. Prescriptions: CAPSAICIN APR 0.075 % CREA (CAPSAICIN) Apply to affected areas three times a day as needed pain  #3x100g x 3   Entered and Authorized by:   Norton Blizzard MD   Signed by:   Norton Blizzard MD on 01/23/2010   Method used:   Print then Give to Patient   RxID:   [id number] TRAMADOL HCL 50 MG TABS (TRAMADOL HCL) one tab by mouth three times a day as needed pain  #90 x 0   Entered and Authorized by:   Norton Blizzard MD   Signed by:   Norton Blizzard MD on 01/23/2010   Method  used:   Print then Give to Patient   RxID:   [id number]    Orders Added: 1)  Diagnostic X-Ray/Fluoroscopy [Diagnostic X-Ray/Flu] 2)  Diagnostic X-Ray/Fluoroscopy [Diagnostic X-Ray/Flu] 3)  New Patient Level III [56213]

## 2010-04-21 ENCOUNTER — Encounter (INDEPENDENT_AMBULATORY_CARE_PROVIDER_SITE_OTHER): Payer: PRIVATE HEALTH INSURANCE | Admitting: Internal Medicine

## 2010-04-21 ENCOUNTER — Encounter: Payer: Self-pay | Admitting: Internal Medicine

## 2010-04-21 DIAGNOSIS — I10 Essential (primary) hypertension: Secondary | ICD-10-CM

## 2010-04-21 DIAGNOSIS — R070 Pain in throat: Secondary | ICD-10-CM

## 2010-04-21 DIAGNOSIS — J029 Acute pharyngitis, unspecified: Secondary | ICD-10-CM

## 2010-05-05 ENCOUNTER — Telehealth: Payer: Self-pay | Admitting: Internal Medicine

## 2010-05-05 NOTE — Telephone Encounter (Signed)
Pt called stating that her throat is still hurting. Requesting that Dr. Artist Pais call her in another antibiotic.

## 2010-05-08 ENCOUNTER — Other Ambulatory Visit: Payer: Self-pay | Admitting: Internal Medicine

## 2010-05-08 DIAGNOSIS — I10 Essential (primary) hypertension: Secondary | ICD-10-CM

## 2010-05-08 NOTE — Telephone Encounter (Signed)
Call returned to patient at [phone], no answer.  A detailed voice message was left for patient to return call regarding phone message for throat discomfort

## 2010-05-09 NOTE — Telephone Encounter (Signed)
No return call from patient regarding phone message. 

## 2010-05-11 NOTE — Assessment & Plan Note (Signed)
Summary: Sore throat/pt fasting/ss   Vital Signs:  Patient profile:   45 year old female Height:      60 inches Weight:      300.50 pounds BMI:     58.90 O2 Sat:      98 % on Room air Temp:     98.3 degrees F oral Pulse rate:   84 / minute Resp:     22 per minute BP sitting:   160 / 100  (right arm) Cuff size:   Thigh  Vitals Entered By: Glendell Docker CMA (April 21, 2010 11:18 AM)  O2 Flow:  Room air CC: sore throat Is Patient Diabetic? No Pain Assessment Patient in pain? no      Comments states he throats feels like it is closing on her, ear pain, pain with swallowin ,productivce cough green in color, fever at home  at home 101, seen in ER at [facility], inform strep swab wa negative, given a rx for cough syrup, taken with no imnprovement caused severe cramping in leg   Primary Care Provider:  Dondra Spry DO  CC:  sore throat.  History of Present Illness: pt was seen at ER at [place] severe throat pain, ear pain.  onset 10 days ago she was tested for strep throat - reported negative  clonidine given for high bp  Allergies (verified): No Known Drug Allergies  Past History:  Past Surgical History: Hysterectomy  Tubal ligation         Social History: Occupation:  unemployed Laid off cook from [employer] Single - lives with son who is 46 y/o 3 daughters   5 sons   Never Smoked   Alcohol use-no Drug use-no   Physical Exam  General:  well-developed and overweight-appearing.   Head:  normocephalic and atraumatic.   Ears:  R ear normal and L ear normal.   Mouth:  pharyngeal erythema.   Neck:  mild neck tenderness,  no masses Lungs:  normal respiratory effort, normal breath sounds, no crackles, and no wheezes.   Heart:  normal rate, regular rhythm, and no gallop.     Impression & Recommendations:  Problem # 1:  ACUTE PHARYNGITIS (ICD-462)  Her updated medication list for this problem includes:    Amoxicillin 875 Mg Tabs  (Amoxicillin) ..... One by mouth two times a day  Problem # 2:  HYPERTENSION (ICD-401.9) pt having chronic throat irritation.  I suspect symptoms from ACE inhibitor Change to ARB  The following medications were removed from the medication list:    Lisinopril-hydrochlorothiazide 20-12.5 Mg Tabs (Lisinopril-hydrochlorothiazide) ..... One by mouth once daily Her updated medication list for this problem includes:    Diovan Hct 320-25 Mg Tabs (Valsartan-hydrochlorothiazide) ..... One by mouth once daily  Complete Medication List: 1)  Pravastatin Sodium 40 Mg Tabs (Pravastatin sodium) .... One by mouth once daily 2)  Tramadol Hcl 50 Mg Tabs (Tramadol hcl) .... One tab by mouth three times a day as needed pain 3)  Metformin Hcl 500 Mg Xr24h-tab (Metformin hcl) .... One by mouth two times a day 4)  Amoxicillin 875 Mg Tabs (Amoxicillin) .... One by mouth two times a day 5)  Diovan Hct 320-25 Mg Tabs (Valsartan-hydrochlorothiazide) .... One by mouth once daily  Other Orders: Rapid Strep (04540)  Patient Instructions: 1)  Gargle with warm salt water two times a day as needed 2)  Stop taking lisinopril/hctz 3)  Please schedule a follow-up appointment in 1 month. 4)  BMP prior to visit, ICD-9: 401.9 5)  Hepatic Panel prior to visit, ICD-9: 272.4 6)  Lipid Panel prior to visit, ICD-9: 272.4 7)  HbgA1C prior to visit, ICD-9: 790.29 8)  Please return for lab work one (1) week before your next appointment.  Prescriptions: DIOVAN HCT 320-25 MG TABS (VALSARTAN-HYDROCHLOROTHIAZIDE) one by mouth once daily  #30 x 1   Entered and Authorized by:   D. Thomos Lemons DO   Signed by:   D. Thomos Lemons DO on 04/21/2010   Method used:   Electronically to        [facility] [phone]* (retail)       [address]       [place], [place]  [address]       Ph: [phone]       Fax: [phone]   RxID:   [id number] AMOXICILLIN 875 MG TABS (AMOXICILLIN) one by mouth two times a day  #20 x 0   Entered  and Authorized by:   D. Thomos Lemons DO   Signed by:   D. Thomos Lemons DO on 04/21/2010   Method used:   Electronically to        [facility] [phone]* (retail)       [address]       [place], [place]  [address]       Ph: [phone]       Fax: [phone]   RxID:   [id number]    Orders Added: 1)  Rapid Strep [06301] 2)  Est. Patient Level III [60109]    Current Allergies (reviewed today): No known allergies   Laboratory Results    Other Tests  Rapid Strep: negative

## 2010-05-12 NOTE — Telephone Encounter (Signed)
rx refill sent to pharmacy 

## 2010-05-24 ENCOUNTER — Telehealth: Payer: Self-pay | Admitting: *Deleted

## 2010-05-24 DIAGNOSIS — R209 Unspecified disturbances of skin sensation: Secondary | ICD-10-CM

## 2010-05-24 DIAGNOSIS — R7309 Other abnormal glucose: Secondary | ICD-10-CM

## 2010-05-24 DIAGNOSIS — I10 Essential (primary) hypertension: Secondary | ICD-10-CM

## 2010-05-24 DIAGNOSIS — E785 Hyperlipidemia, unspecified: Secondary | ICD-10-CM

## 2010-05-24 NOTE — Telephone Encounter (Signed)
Call placed to patient at [phone], she was advised of blood work needed,and she has scheduled follow up for 06/09/2010. Lab orders have been entered for [facility] per patient request

## 2010-05-24 NOTE — Telephone Encounter (Signed)
Message copied by Glendell Docker on Wed May 24, 2010 10:08 AM ------      Message from: Elba Barman      Created: Wed May 24, 2010  9:58 AM       Pt states that she is supposed to come in for blood work? I'm not sure if pt is supposed to come in or not. Pt does not have upcoming appt. Best # to reach pt at is [phone].

## 2010-05-25 ENCOUNTER — Emergency Department (HOSPITAL_BASED_OUTPATIENT_CLINIC_OR_DEPARTMENT_OTHER)
Admission: EM | Admit: 2010-05-25 | Discharge: 2010-05-25 | Disposition: A | Discharge status: Home / Self Care | Payer: No Typology Code available for payment source | Attending: Emergency Medicine | Admitting: Emergency Medicine

## 2010-05-25 ENCOUNTER — Emergency Department (INDEPENDENT_AMBULATORY_CARE_PROVIDER_SITE_OTHER): Payer: No Typology Code available for payment source

## 2010-05-25 DIAGNOSIS — R42 Dizziness and giddiness: Secondary | ICD-10-CM | POA: Insufficient documentation

## 2010-05-25 DIAGNOSIS — Z8739 Personal history of other diseases of the musculoskeletal system and connective tissue: Secondary | ICD-10-CM | POA: Insufficient documentation

## 2010-05-25 DIAGNOSIS — M542 Cervicalgia: Secondary | ICD-10-CM

## 2010-05-25 DIAGNOSIS — E876 Hypokalemia: Secondary | ICD-10-CM | POA: Insufficient documentation

## 2010-05-25 DIAGNOSIS — I10 Essential (primary) hypertension: Secondary | ICD-10-CM | POA: Insufficient documentation

## 2010-05-25 DIAGNOSIS — E119 Type 2 diabetes mellitus without complications: Secondary | ICD-10-CM | POA: Insufficient documentation

## 2010-05-25 LAB — DIFFERENTIAL
Basophils Relative: 1 % (ref 0–1)
Eosinophils Absolute: 0.1 10*3/uL (ref 0.0–0.7)
Lymphs Abs: 2.1 10*3/uL (ref 0.7–4.0)
Neutro Abs: 3.6 10*3/uL (ref 1.7–7.7)
Neutrophils Relative %: 57 % (ref 43–77)

## 2010-05-25 LAB — CBC
HCT: 32.8 % — ABNORMAL LOW (ref 36.0–46.0)
Hemoglobin: 11.2 g/dL — ABNORMAL LOW (ref 12.0–15.0)
MCH: 29.3 pg (ref 26.0–34.0)
MCHC: 34.1 g/dL (ref 30.0–36.0)
MCV: 85.9 fL (ref 78.0–100.0)
Platelets: 314 10*3/uL (ref 150–400)
RBC: 3.82 MIL/uL — ABNORMAL LOW (ref 3.87–5.11)
WBC: 6.2 10*3/uL (ref 4.0–10.5)

## 2010-05-25 LAB — URINALYSIS, ROUTINE W REFLEX MICROSCOPIC
Hgb urine dipstick: NEGATIVE
Nitrite: NEGATIVE
Specific Gravity, Urine: 1.022 (ref 1.005–1.030)
pH: 6 (ref 5.0–8.0)

## 2010-05-25 LAB — BASIC METABOLIC PANEL
CO2: 30 mEq/L (ref 19–32)
Calcium: 8.9 mg/dL (ref 8.4–10.5)
Potassium: 3.1 mEq/L — ABNORMAL LOW (ref 3.5–5.1)

## 2010-05-25 LAB — URINE MICROSCOPIC-ADD ON

## 2010-05-26 ENCOUNTER — Ambulatory Visit (INDEPENDENT_AMBULATORY_CARE_PROVIDER_SITE_OTHER): Payer: Self-pay | Admitting: Family

## 2010-05-26 ENCOUNTER — Emergency Department (INDEPENDENT_AMBULATORY_CARE_PROVIDER_SITE_OTHER): Payer: No Typology Code available for payment source

## 2010-05-26 ENCOUNTER — Inpatient Hospital Stay (HOSPITAL_COMMUNITY)
Admission: AD | Admit: 2010-05-26 | Discharge: 2010-05-30 | DRG: 603 | Disposition: A | Discharge status: Home / Self Care | Payer: No Typology Code available for payment source | Source: Other Acute Inpatient Hospital | Attending: Internal Medicine | Admitting: Internal Medicine

## 2010-05-26 ENCOUNTER — Emergency Department (HOSPITAL_BASED_OUTPATIENT_CLINIC_OR_DEPARTMENT_OTHER)
Admission: EM | Admit: 2010-05-26 | Discharge: 2010-05-26 | Disposition: A | Discharge status: Nursing Home (Includes ICF) | Payer: No Typology Code available for payment source | Source: Home / Self Care | Attending: Emergency Medicine | Admitting: Emergency Medicine

## 2010-05-26 VITALS — BP 170/118 | HR 104 | Temp 98.1°F | Resp 18 | Wt 297.0 lb

## 2010-05-26 DIAGNOSIS — I10 Essential (primary) hypertension: Secondary | ICD-10-CM | POA: Diagnosis present

## 2010-05-26 DIAGNOSIS — R1310 Dysphagia, unspecified: Secondary | ICD-10-CM

## 2010-05-26 DIAGNOSIS — J029 Acute pharyngitis, unspecified: Secondary | ICD-10-CM

## 2010-05-26 DIAGNOSIS — L0211 Cutaneous abscess of neck: Principal | ICD-10-CM | POA: Diagnosis present

## 2010-05-26 DIAGNOSIS — M542 Cervicalgia: Secondary | ICD-10-CM | POA: Insufficient documentation

## 2010-05-26 DIAGNOSIS — E669 Obesity, unspecified: Secondary | ICD-10-CM | POA: Diagnosis present

## 2010-05-26 DIAGNOSIS — R51 Headache: Secondary | ICD-10-CM | POA: Diagnosis not present

## 2010-05-26 DIAGNOSIS — M1990 Unspecified osteoarthritis, unspecified site: Secondary | ICD-10-CM | POA: Diagnosis present

## 2010-05-26 DIAGNOSIS — E119 Type 2 diabetes mellitus without complications: Secondary | ICD-10-CM | POA: Diagnosis present

## 2010-05-26 DIAGNOSIS — E785 Hyperlipidemia, unspecified: Secondary | ICD-10-CM | POA: Diagnosis present

## 2010-05-26 DIAGNOSIS — L03211 Cellulitis of face: Secondary | ICD-10-CM | POA: Diagnosis present

## 2010-05-26 DIAGNOSIS — Z79899 Other long term (current) drug therapy: Secondary | ICD-10-CM | POA: Insufficient documentation

## 2010-05-26 DIAGNOSIS — R599 Enlarged lymph nodes, unspecified: Secondary | ICD-10-CM | POA: Insufficient documentation

## 2010-05-26 DIAGNOSIS — L0201 Cutaneous abscess of face: Secondary | ICD-10-CM | POA: Diagnosis present

## 2010-05-26 DIAGNOSIS — I152 Hypertension secondary to endocrine disorders: Secondary | ICD-10-CM | POA: Insufficient documentation

## 2010-05-26 LAB — BASIC METABOLIC PANEL
BUN: 17 mg/dL (ref 6–23)
BUN: 16 mg/dL (ref 6–23)
Calcium: 9.3 mg/dL (ref 8.4–10.5)
Chloride: 101 mEq/L (ref 96–112)
Creat: 0.99 mg/dL (ref 0.40–1.20)
Creatinine, Ser: 1 mg/dL (ref 0.4–1.2)
Glucose, Bld: 106 mg/dL — ABNORMAL HIGH (ref 70–99)
Potassium: 3.6 mEq/L (ref 3.5–5.1)

## 2010-05-26 LAB — RAPID URINE DRUG SCREEN, HOSP PERFORMED
Amphetamines: NOT DETECTED
Barbiturates: NOT DETECTED
Benzodiazepines: NOT DETECTED
Opiates: POSITIVE — AB
Tetrahydrocannabinol: NOT DETECTED

## 2010-05-26 LAB — POCT RAPID STREP A (OFFICE): Rapid Strep A Screen: NEGATIVE

## 2010-05-26 LAB — CBC
HCT: 37.2 % (ref 36.0–46.0)
MCH: 28.6 pg (ref 26.0–34.0)
MCV: 85.1 fL (ref 78.0–100.0)
Platelets: 337 10*3/uL (ref 150–400)
RBC: 4.37 MIL/uL (ref 3.87–5.11)
RDW: 13.9 % (ref 11.5–15.5)

## 2010-05-26 LAB — DIFFERENTIAL
Basophils Relative: 0 % (ref 0–1)
Neutro Abs: 4.6 10*3/uL (ref 1.7–7.7)
Neutrophils Relative %: 68 % (ref 43–77)

## 2010-05-26 LAB — LIPID PANEL
Cholesterol: 315 mg/dL — ABNORMAL HIGH (ref 0–200)
HDL: 71 mg/dL (ref >39)
LDL Cholesterol: 213 mg/dL — ABNORMAL HIGH (ref 0–99)
Triglycerides: 155 mg/dL — ABNORMAL HIGH (ref <150)

## 2010-05-26 LAB — GLUCOSE, CAPILLARY: Glucose-Capillary: 108 mg/dL — ABNORMAL HIGH (ref 70–99)

## 2010-05-26 MED ORDER — IOHEXOL 350 MG/ML SOLN
100.0000 mL | Freq: Once | INTRAVENOUS | Status: AC | PRN
  Administered 2010-05-26: 100 mL via INTRAVENOUS

## 2010-05-26 NOTE — Patient Instructions (Signed)
Please go directly to the ED. Follow up with Dr. Artist Pais in 1 week.

## 2010-05-26 NOTE — Assessment & Plan Note (Signed)
BP is up today- pt reports that she is not able to swallow her pills.

## 2010-05-26 NOTE — Progress Notes (Signed)
  Subjective:    Patient ID: Faith [name], female    DOB: 03-27-1965, 45 y.o.   MRN: [id number]  HPI  Ms.  Groesbeck is a 45 yr old female who presents with complaint of neck pain.  Symptoms started about 1 week ago.  Initially her neck felt "tired."  Now feels like she cannot "get up or lay down without help."  Denies associated fever.  Pain is in the back of her neck and radiates around to the front of her neck.  Notes associated pain with swallowing.  Notes that her glands have felt swollen.  Denies associated photophobia.  Notes that she has been unable to eat or drink anything since yesterday.  Denies previous hx of similar symptoms.  HTN- Did not take her BP med as she was unable to swallow.     Review of Systems  Constitutional: Positive for appetite change and fatigue.  Respiratory: Negative for cough and shortness of breath.   Cardiovascular: Negative for chest pain.  Neurological: Negative for numbness.       No past medical history on file.  History   Social History  . Marital Status: Single    Spouse Name: N/A    Number of Children: N/A  . Years of Education: N/A   Occupational History  . Not on file.   Social History Main Topics  . Smoking status: Not on file  . Smokeless tobacco: Not on file  . Alcohol Use: Not on file  . Drug Use: Not on file  . Sexually Active: Not on file   Other Topics Concern  . Not on file   Social History Narrative  . No narrative on file    No past surgical history on file.  No family history on file.  Allergies no known allergies  Current Outpatient Prescriptions on File Prior to Visit  Medication Sig Dispense Refill  . metoprolol (TOPROL-XL) 50 MG 24 hr tablet Take 1 tablet daily  30 tablet  3    BP 170/118  Pulse 104  Temp(Src) 98.1 F (36.7 C) (Oral)  Resp 18  Wt 297 lb 0.6 oz (134.736 kg)    Objective:   Physical Exam  Constitutional: She appears well-developed.       Morbidly obese AA female, appears  uncomforable, tearful. Seated in chair.   HENT:  Head: Normocephalic and atraumatic.  Mouth/Throat: Uvula is midline.       No tongue lesions, but posterior pharynx is difficult to visualize due to large tongue.  Eyes: Conjunctivae are normal. Pupils are equal, round, and reactive to light.  Neck: Muscular tenderness present. Rigidity present. No edema and no erythema present.       + acutely tender  to palpation on the left side of neck.    Cardiovascular: Normal rate and regular rhythm.   Pulmonary/Chest: Effort normal and breath sounds normal.  Musculoskeletal:       Decreased ROM of neck due to pain.  Attempted to test bilateral upper extremity strength- but effort was poor- difficult ot assess.  Psychiatric:       A and o x 3, flat affect.           Assessment & Plan:

## 2010-05-26 NOTE — Assessment & Plan Note (Addendum)
45 yr old female with acute neck pain and associated dysphagia.  Rapid strep neg today in office. She is unable to eat or drink.  She will need CT of the neck and baseline lab, probably IV hydration.  Will refer her to the ED for further evaluation.  Pt is agreeable.  Report was given to Amy- charge nurse in ED.

## 2010-05-27 LAB — GLUCOSE, CAPILLARY
Glucose-Capillary: 109 mg/dL — ABNORMAL HIGH (ref 70–99)
Glucose-Capillary: 89 mg/dL (ref 70–99)
Glucose-Capillary: 105 mg/dL — ABNORMAL HIGH (ref 70–99)

## 2010-05-27 LAB — CBC
HCT: 33.5 % — ABNORMAL LOW (ref 36.0–46.0)
MCH: 29 pg (ref 26.0–34.0)
MCHC: 33.4 g/dL (ref 30.0–36.0)
MCV: 86.8 fL (ref 78.0–100.0)
Platelets: 296 10*3/uL (ref 150–400)
RDW: 13.9 % (ref 11.5–15.5)
WBC: 6.5 10*3/uL (ref 4.0–10.5)

## 2010-05-27 LAB — BASIC METABOLIC PANEL
BUN: 10 mg/dL (ref 6–23)
Calcium: 8.9 mg/dL (ref 8.4–10.5)
Creatinine, Ser: 1.06 mg/dL (ref 0.4–1.2)
GFR calc non Af Amer: 56 mL/min — ABNORMAL LOW (ref >60)
Glucose, Bld: 107 mg/dL — ABNORMAL HIGH (ref 70–99)

## 2010-05-27 LAB — STREP A DNA PROBE: Group A Strep Probe: NEGATIVE

## 2010-05-28 LAB — GLUCOSE, CAPILLARY
Glucose-Capillary: 105 mg/dL — ABNORMAL HIGH (ref 70–99)
Glucose-Capillary: 111 mg/dL — ABNORMAL HIGH (ref 70–99)
Glucose-Capillary: 116 mg/dL — ABNORMAL HIGH (ref 70–99)

## 2010-05-28 LAB — BASIC METABOLIC PANEL
BUN: 9 mg/dL (ref 6–23)
GFR calc Af Amer: >60 mL/min (ref >60)
GFR calc non Af Amer: 51 mL/min — ABNORMAL LOW (ref >60)
Potassium: 3.4 mEq/L — ABNORMAL LOW (ref 3.5–5.1)

## 2010-05-28 LAB — CBC
MCV: 87.1 fL (ref 78.0–100.0)
Platelets: 271 10*3/uL (ref 150–400)
RBC: 3.64 MIL/uL — ABNORMAL LOW (ref 3.87–5.11)
RDW: 13.7 % (ref 11.5–15.5)
WBC: 5.4 10*3/uL (ref 4.0–10.5)

## 2010-05-29 LAB — GLUCOSE, CAPILLARY
Glucose-Capillary: 89 mg/dL (ref 70–99)
Glucose-Capillary: 86 mg/dL (ref 70–99)

## 2010-05-30 LAB — GLUCOSE, CAPILLARY

## 2010-05-30 LAB — BASIC METABOLIC PANEL
Chloride: 109 mEq/L (ref 96–112)
GFR calc Af Amer: >60 mL/min (ref >60)
Potassium: 3.6 mEq/L (ref 3.5–5.1)

## 2010-06-01 NOTE — Discharge Summary (Signed)
Faith [name], STENGEL                 ACCOUNT NO.:  [id number]  MEDICAL RECORD NO.:  [id number]           PATIENT TYPE:  I  LOCATION:  1406                         FACILITY:  [facility]  PHYSICIAN:  Erick Blinks, MD     DATE OF BIRTH:  1965-08-12  DATE OF ADMISSION:  05/26/2010 DATE OF DISCHARGE:  05/30/2010                              DISCHARGE SUMMARY   PRIMARY CARE PHYSICIAN:  Barbette Hair. Artist Pais, DO  DISCHARGE DIAGNOSES: 1. Cellulitis of the neck and left face, improved. 2. Accelerated hypertension. 3. Hyperlipidemia. 4. Obesity. 5. Non-insulin dependent diabetes. 6. Osteoarthritis.  DISCHARGE MEDICATIONS: 1. Clonidine 0.1 mg p.o. b.i.d. 2. Metoprolol 50 mg p.o. t.i.d. 3. Pravachol 40 mg p.o. daily. 4. Tramadol 50 mg p.o. t.i.d. p.r.n. 5. Metformin 500 mg p.o. b.i.d. 6. Diovan/hydrochlorothiazide 320/25 mg 1 tablet p.o. daily. 7. Bactrim DS 1 tablet p.o. b.i.d. for 6 more days.  ADMISSION HISTORY:  This is a 45 year old female with past medical history of hypertension and non-insulin dependent diabetes who presents to the hospital with complaints of neck pain.  The pain started approximately 1 week prior to admission on the posterior part of her neck.  This progressively got worse and encompasses her entire neck. She had difficulty swallowing and was unable to tolerate any p.o.  CT of the neck done in the emergency room revealed abnormal retropharyngeal fluid, but no rim enhancement or organized collection to suggest an abscess.  The patient was subsequently admitted from [facility] for IV antibiotics.  For further details, please refer to the history and physical dictated by Dr. Onalee Hua on April 13th.  HOSPITAL COURSE: 1. Cellulitis.  The patient was placed empirically on vancomycin and     Zosyn.  Throughout her hospital course, she has had slow     progression, but resolution of her cellulitis.  She is able to     swallow POs comfortably.  Swelling and  tenderness on her neck has     resolved.  She does have some slight pain in her throat while     swallowing, but this is dramatically improved since her admission.     She is afebrile, has normal WBC count.  We will switch her over to     oral Bactrim to complete a total of 10 days of antibiotics. 2. Accelerated hypertension.  The patient reports that she has had     difficulty to control hypertension and has been running in the 180s     to 200s even as an outpatient.  She has been started on metoprolol     here as well as clonidine and her blood pressures continued to     remain high in the 160s.  It is recommended that workup for     secondary causes of hypertension be considered as an outpatient.     We will defer further management to her primary care physician. 3. Non-insulin dependent diabetes.  We will continue metformin for     now.  CONSULTATIONS:  The case was discussed with the Dr. Jearld Fenton from ENT and since no abscess was found  on the CT, surgical intervention was not felt to be necessary.  PROCEDURES:  None.  DIAGNOSTIC IMAGING:1. Chest x-ray from April 12th shows borderline cardiomegaly, no     active lung disease. 2. CT of the neck on April 13th shows abnormal retropharyngeal fluid,     but without rim enhancement or organized collection, favoring     inflammatory or infectious retropharyngeal edema/effusion, probable     surgically drainable at this time.  Conspicuous, but small     retropharyngeal lymph nodes, no significant cervical     lymphadenopathy or focal pharyngeal abnormality, no evidence of     calcified longus colli tendinopathy  DISCHARGE INSTRUCTIONS:  The patient should continue on a heart-healthy low-calorie diet, conduct her activity as tolerated.  She will need to follow up with primary care physician in next 1 week and have adjustment of her antihypertensives.  CONDITION AT TIME OF DISCHARGE:  Improved.  Plan was discussed with the patient who is  in an agreement.     Erick Blinks, MD     JM/MEDQ  D:  05/30/2010  T:  05/31/2010  Job:  [id number]  cc:   Barbette Hair. Artist Pais, DO [address] [place], [place] [address]  Electronically Signed by Erick Blinks  on 06/01/2010 01:14:54 PM

## 2010-06-05 NOTE — H&P (Signed)
Faith [name], Faith [name]                 ACCOUNT NO.:  [id number]  MEDICAL RECORD NO.:  [id number]           PATIENT TYPE:  I  LOCATION:  1406                         FACILITY:  [facility]  PHYSICIAN:  Mariea Stable, MD   DATE OF BIRTH:  04/22/1965  DATE OF ADMISSION:  05/26/2010 DATE OF DISCHARGE:                             HISTORY & PHYSICAL   PRIMARY CARE PHYSICIAN:  Dr. Thomos Lemons.  CHIEF COMPLAINT:  Neck pain.  HISTORY OF PRESENT ILLNESS:  Faith [name] is a 45 year old woman with past medical history significant for hypertension and diabetes, who presents with a chief complaint of neck pain.  The patient states that approximately 1 week ago, she started having pain at the posterior part of her neck.  This was progressively worse and now encompasses her entire neck.  She describes this as an initial feeling of fullness, although this has been becoming progressively more aching with a sharp component.  She describes difficulty swallowing starting last night, which reaches peak in intensity.  This morning, she went to [facility] (primary care) at which time, CT scan of the neck was ordered.  CT revealed abnormal retropharyngeal fluid, but no rim enhancement or organized collection to suggest an abscess.  This favored inflammatory infectious retropharyngeal edema/effusion.  At that time, the patient was sent to the [facility] for further evaluation.  From there, Dr. Freida Busman evaluated the patient and discussed the case with Dr. Jearld Fenton (ENT).  However, since there is no focal abscess for drainage, they recommended admission for IV antibiotics.  At that time, the patient was transferred to [facility] to admit.  Of note, reviewing more history with the patient as well as outpatient records, the patient had been treated in the past for an acute pharyngitis per notes of April 21, 2010.  It seems that at that time, the patient had been having chronic throat  irritation, which was suspected that to be secondary to her ACE inhibitor.  This was discontinued at that time and she was started on an angiotensin receptor blocker (Diovan).  The patient states that after making this change, her symptoms had increased significantly.  PAST MEDICAL HISTORY: 1. Diabetes. 2. Hypertension. 3. Osteoarthritis. 4. History of hysterectomy. 5. Tubal ligation.  MEDICATIONS: 1. Pravastatin 40 mg p.o. daily. 2. Tramadol 50 mg p.o. t.i.d. p.r.n. pain. 3. Metformin XR 500 mg 1 tablet b.i.d. 4. Diovan HCTZ 320/25 mg p.o. daily.  ALLERGIES:  Records indicate no known drug allergies.  However, the patient states that DARVOCET and PERCOCET make her crazy.  SOCIAL HISTORY:  The patient lives with her 2 sons.  She states that in case of need for medical decision making, her daughter, Lanae Federer in the one to be contacted.  Her cell phone number is [phone].  The patient denies any tobacco, alcohol, or drug use.  FAMILY HISTORY:  Noncontributory.  REVIEW OF SYSTEMS:  As per HPI.  All others reviewed and negative.  PHYSICAL EXAMINATION:  VITAL SIGNS:  Stable, hypertension noted. HEENT:  Head is normocephalic and atraumatic.  Pupils equally round and reactive to light  and accommodation.  Extraocular movements are intact. Sclerae are anicteric.  Mucous membranes are moist.  I have difficulty seeing the posterior pharynx, but there are no obvious lesions noted. The patient does have diffuse tenderness with passive or active range of motion.  There is tenderness to palpation throughout as well. NECK:  There are no carotid bruits appreciated. LUNGS:  Clear to auscultation bilaterally with good air movement. HEART:  S1 and S2 are normal with regular rate and rhythm.  There are no murmurs, gallops, or rubs. ABDOMEN:  Positive bowel sounds, soft, nontender, and nondistended. EXTREMITIES:  There is no cyanosis, clubbing, or edema. NEUROLOGIC:  The patient is  awake, alert, and oriented x3.  Cranial nerves are intact.  Motor is intact.  Sensation is intact.  LABORATORY DATA:  UA is negative except for 30 protein.  WBCs 6.2, hemoglobin 11.2, and platelets 314.  Sodium 141, potassium 3.1, chloride 98, bicarbonate 30, glucose 98, BUN 21, creatinine 1.3, and calcium 8.9. Please note that these labs were done, May 25, 2010.  Labs from today, show WBCs 6.8, hemoglobin 12.5, and platelets 337.  Metabolic panel is completely within normal limits except for a glucose of 106.  IMAGING:  CT scan of the neck with contrast, impression: 1. Abnormal retropharyngeal fluid, but without rim enhancement or     organized fluid collection.  Favor inflammatory infectious     retropharyngeal edema/effusion (probably not surgically drainable     at this time). 2. Conspicuous, but small retropharyngeal lymph nodes.  No significant     cervical lymphadenopathy or focal pharyngeal abnormality. 3. No evidence of calcified longus colli tendinopathy.  ASSESSMENT AND PLAN: 1. Neck pain/retropharyngeal edema/effusion.  This is currently of     unclear etiology, but favor infectious etiology.  We will go ahead     and treat empirically with Unasyn to cover gram-positives, gram-     negatives, and anaerobes (no pseudomonal coverage necessary for     this).  ENT has been made aware of the case, but since there is no     surgically drainable lesion, they will need to be consulted if the     patient's clinical status changes.  Furthermore, I doubt that this     is related to her ACE inhibitor as this was discontinued back in     approximately a month ago.  However, she is on an ARB.  Again, I     doubt this is related to this, but we will go ahead and hold off on     the ARB while inpatient and reconsider depending on her clinical     course. 2. Diabetes.  We will go ahead and cover the patient with sliding     scale insulin while in-house. 3. Hypertension.  We will  continue the patient's metoprolol and HCTZ.     As mentioned, we will hold off on the ARB, given problem #1.     Mariea Stable, MD     MA/MEDQ  D:  05/26/2010  T:  05/26/2010  Job:  [id number]  cc:   Barbette Hair. [place], DO [address] [place], [place] [address]  Dr. Freida Busman.  Suzanna Obey, M.D. Fax: [phone]  Electronically Signed by Mariea Stable MD on 06/05/2010 10:32:45 AM

## 2010-06-07 ENCOUNTER — Encounter: Payer: Self-pay | Admitting: Internal Medicine

## 2010-06-09 ENCOUNTER — Telehealth: Payer: Self-pay | Admitting: Internal Medicine

## 2010-06-09 ENCOUNTER — Ambulatory Visit (INDEPENDENT_AMBULATORY_CARE_PROVIDER_SITE_OTHER): Payer: No Typology Code available for payment source | Admitting: Internal Medicine

## 2010-06-09 ENCOUNTER — Encounter: Payer: Self-pay | Admitting: Internal Medicine

## 2010-06-09 VITALS — BP 130/96 | HR 78 | Temp 98.2°F | Resp 20 | Wt 300.0 lb

## 2010-06-09 DIAGNOSIS — M542 Cervicalgia: Secondary | ICD-10-CM

## 2010-06-09 DIAGNOSIS — R7309 Other abnormal glucose: Secondary | ICD-10-CM

## 2010-06-09 DIAGNOSIS — I10 Essential (primary) hypertension: Secondary | ICD-10-CM

## 2010-06-09 DIAGNOSIS — F419 Anxiety disorder, unspecified: Secondary | ICD-10-CM

## 2010-06-09 LAB — HM PAP SMEAR

## 2010-06-09 MED ORDER — CLINDAMYCIN HCL 300 MG PO CAPS: 300.0000 mg | ORAL_CAPSULE | Freq: Three times a day (TID) | ORAL | Status: AC

## 2010-06-09 NOTE — Telephone Encounter (Signed)
Pt saw dr. Artist Pais today and was referred to [facility]. Pt states that she was too claustrophobic to do the MRI. MRI is resch for this Monday 06-12-10 at 7:45. Pt would like to know if Dr. Artist Pais could call her in something to help her relax for the mri.

## 2010-06-09 NOTE — Patient Instructions (Signed)
Please go to the emergency room if your neck symptoms do not improve or gets worse. Use monistat over the counter x 3 days.

## 2010-06-09 NOTE — Telephone Encounter (Signed)
Please call in alprazolam 0.5 mg  Qty 5 tabs.  Take 1/2 hr before MRI

## 2010-06-09 NOTE — Progress Notes (Signed)
Subjective:    Patient ID: Faith [name], female    DOB: 05-02-1965, 45 y.o.   MRN: [id number]  HPI 45 y/o female for follow up.  Pt seen by NP on 05/26/2010 and in ER for severe sore throat.  Pt also noted trouble swallowing.  CT of Neck showed:      1.  Abnormal retropharyngeal fluid, but without rim enhancement or organized collection. Favor inflammatory or infectious retropharyngeal edema/effusion - probably not surgically drainable at this time. 2. Conspicuous but small retropharyngeal lymph nodes.  No significant cervical lymphadenopathy or focal pharyngeal abnormality. 3.  No evidence of calcified longus colli tendinopathy.  Pt treated with admitted and treated with IV abx (vancomycin) then po bactrim as out pt for neck cellulitis.  She reports finishing full course of abx.   Pt has recurrence of neck pain.  Pt reports aching sensation.  She describes "annoying sensation".   Worse with leaning backwards.  Review of Systems    No fever or chills,  No dysphagia Past Medical History  Diagnosis Date  . Hyperlipidemia   . Hypertension   . Morbid obesity   . Osteoarthritis, chronic     bilateral knees    History   Social History  . Marital Status: Single    Spouse Name: N/A    Number of Children: N/A  . Years of Education: N/A   Occupational History  . Not on file.   Social History Main Topics  . Smoking status: Never Smoker   . Smokeless tobacco: Not on file  . Alcohol Use: No  . Drug Use: No  . Sexually Active: Not on file   Other Topics Concern  . 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 Total abdominal hysterectomy   . Tubal ligation     Family History  Problem Relation Age of Onset  . Hypertension Father   . Anuerysm Mother 83    mother died in 45 at age  26 from brain aneurysm  . Other      estranged from father    No Known Allergies  Current Outpatient Prescriptions on File Prior to Visit  Medication Sig Dispense Refill  . metFORMIN (GLUCOPHAGE-XR) 500 MG 24 hr tablet Take 500 mg by mouth 2 (two) times daily.        . metoprolol (TOPROL-XL) 50 MG 24 hr tablet Take 1 tablet daily  30 tablet  3  . pravastatin (PRAVACHOL) 40 MG tablet Take 40 mg by mouth daily.        . traMADol (ULTRAM) 50 MG tablet Take 50 mg by mouth 3 (three) times daily as needed.        . valsartan-hydrochlorothiazide (DIOVAN-HCT) 320-25 MG per tablet Take 1 tablet by mouth daily.          BP 130/96  Pulse 78  Temp(Src) 98.2 F (36.8 C) (Oral)  Resp 20  Wt 300 lb (136.079 kg)  SpO2 100%    Objective:   Physical Exam  Constitutional: Appears well-developed and well-nourished. No distress.  Head: Normocephalic and atraumatic.  Right Ear: External ear normal.  Left Ear: External ear normal.  Mouth/Throat: Oropharynx is clear and moist.  Eyes: Conjunctivae are normal. Pupils are equal, round, and reactive to light.  Neck: mild neck tenderness, no mass Cardiovascular: Normal rate, regular rhythm and normal heart sounds.  Exam reveals no gallop and no friction rub.   Pulmonary/Chest: Effort normal and breath sounds normal.  No wheezes. No rales.  Neurological: Alert. No cranial nerve deficit.  Skin: Skin is warm and dry.  Psychiatric: Normal mood and affect. Behavior is normal.      Assessment & Plan:

## 2010-06-11 MED ORDER — ALPRAZOLAM 0.5 MG PO TABS: ORAL_TABLET | ORAL | Status: DC

## 2010-06-11 NOTE — Telephone Encounter (Signed)
Call placed to patient at [phone] was informed Rx phone to pharmacy

## 2010-06-12 ENCOUNTER — Ambulatory Visit
Admission: RE | Admit: 2010-06-12 | Discharge: 2010-06-12 | Disposition: A | Discharge status: Home / Self Care | Payer: PRIVATE HEALTH INSURANCE | Source: Ambulatory Visit | Attending: Internal Medicine | Admitting: Internal Medicine

## 2010-06-12 DIAGNOSIS — M542 Cervicalgia: Secondary | ICD-10-CM

## 2010-06-12 MED ORDER — GADOBENATE DIMEGLUMINE 529 MG/ML IV SOLN: 20.0000 mL | Freq: Once | INTRAVENOUS | Status: AC | PRN

## 2010-06-14 ENCOUNTER — Other Ambulatory Visit: Payer: Self-pay | Admitting: *Deleted

## 2010-06-14 DIAGNOSIS — M542 Cervicalgia: Secondary | ICD-10-CM

## 2010-06-14 MED ORDER — CYCLOBENZAPRINE HCL 5 MG PO TABS: 5.0000 mg | ORAL_TABLET | Freq: Three times a day (TID) | ORAL | Status: DC | PRN

## 2010-07-10 NOTE — Assessment & Plan Note (Signed)
Stable.  Continue current medication regimen.  BP: 130/96 mmHg  Lab Results  Component Value Date   CREATININE 1.13 05/30/2010

## 2010-07-10 NOTE — Assessment & Plan Note (Signed)
Pt admitted and treated with IV abx for presumed neck cellulitis.  She finished full course of abx.  Pt experiencing mild recurrence of neck pain.  Rule out neck abscess.  Obtain MRI of neck. Empiric clindamycin. Pt advised to seek care in ER if symptoms get worsel.

## 2010-07-10 NOTE — Assessment & Plan Note (Signed)
Continue metformin as directed.  Lab Results  Component Value Date   HGBA1C 5.8* 05/24/2010

## 2010-07-17 ENCOUNTER — Telehealth: Payer: Self-pay | Admitting: *Deleted

## 2010-07-17 NOTE — Telephone Encounter (Signed)
Received message from pt requesting samples of Diovan HCT 320-25 and Metoprolol. Advised pt Diovan HCT samples have been placed at front desk for pick up. We do not get samples of Metoprolol but she should still have refill on file with pharmacy and should contact them for refill. Pt voices understanding.

## 2010-08-17 ENCOUNTER — Telehealth: Payer: Self-pay | Admitting: Internal Medicine

## 2010-08-17 MED ORDER — HYDROCHLOROTHIAZIDE 12.5 MG PO TABS: 25.0000 mg | ORAL_TABLET | Freq: Every day | ORAL | Status: DC

## 2010-08-17 MED ORDER — LOSARTAN POTASSIUM 100 MG PO TABS: 100.0000 mg | ORAL_TABLET | Freq: Every day | ORAL | Status: DC

## 2010-08-17 NOTE — Telephone Encounter (Signed)
Patient had a female individual stop by office for samples before receiving a return phone call. Staff was advised to inform person picking up samples, that patients dose was not available.  Call placed to patient at [phone] she was informed samples were not available, and asked if a rx was needed. She stated the medication cost her $ 100 and she could not afford to have the rx filled. Is there a generic alternative that she could take.

## 2010-08-17 NOTE — Telephone Encounter (Signed)
Cozaar 100mg  po qd (generic) and hctz 25mg  po qd. Must check bp daily after change and report results or schedule ov

## 2010-08-17 NOTE — Telephone Encounter (Signed)
Pt states that she would like more samples of diovan

## 2010-08-17 NOTE — Telephone Encounter (Signed)
Call placed to patient at [phone], she was informed of medication change for Diovan. She was advised per Dr Rodena Medin instructions,and informed rx's have been sent to pharmacy. Patient has verbalized understanding and agrees as instructed

## 2010-08-21 ENCOUNTER — Ambulatory Visit: Payer: No Typology Code available for payment source | Admitting: Family Medicine

## 2010-08-21 DIAGNOSIS — Z0289 Encounter for other administrative examinations: Secondary | ICD-10-CM

## 2010-08-28 ENCOUNTER — Telehealth: Payer: Self-pay | Admitting: Internal Medicine

## 2010-08-28 NOTE — Telephone Encounter (Signed)
Refill-amoxicillin 875mg  tab auro. Take one tablet twice daily. Qty 20 last fill 3.9.12

## 2010-08-30 NOTE — Telephone Encounter (Signed)
RX denied. Pharmacy notified.  Pt needs office visit for more antibiotics.

## 2010-08-31 ENCOUNTER — Other Ambulatory Visit: Payer: Self-pay | Admitting: Internal Medicine

## 2010-09-11 ENCOUNTER — Ambulatory Visit (INDEPENDENT_AMBULATORY_CARE_PROVIDER_SITE_OTHER): Payer: No Typology Code available for payment source | Admitting: Internal Medicine

## 2010-09-11 ENCOUNTER — Encounter: Payer: Self-pay | Admitting: Internal Medicine

## 2010-09-11 DIAGNOSIS — R109 Unspecified abdominal pain: Secondary | ICD-10-CM

## 2010-09-11 DIAGNOSIS — J40 Bronchitis, not specified as acute or chronic: Secondary | ICD-10-CM

## 2010-09-11 DIAGNOSIS — I10 Essential (primary) hypertension: Secondary | ICD-10-CM

## 2010-09-11 DIAGNOSIS — N390 Urinary tract infection, site not specified: Secondary | ICD-10-CM | POA: Insufficient documentation

## 2010-09-11 LAB — POCT URINALYSIS DIPSTICK
Bilirubin, UA: NEGATIVE
Glucose, UA: NEGATIVE
Leukocytes, UA: NEGATIVE
Nitrite, UA: NEGATIVE
Urobilinogen, UA: NEGATIVE

## 2010-09-11 MED ORDER — LEVOFLOXACIN 500 MG PO TABS: 500.0000 mg | ORAL_TABLET | Freq: Every day | ORAL | Status: AC

## 2010-09-11 NOTE — Progress Notes (Signed)
  Subjective:    Patient ID: Faith [name], female    DOB: [birth year], 45 y.o.   MRN: [id number]  HPI Pt presents to clinic for evaluation of dysuria and cough. Notes ~4d h/o urinary freq, nausea without emesis, cough productive for green sputum and nasal drainage. Possible chills but no fever. BP initially high on triage and rechecked to be 148/100. Has not taken bp medication yet today. No exacerbating or alleviating factors. No other complaints.  Reviewed pmh, medications and allergies.    Review of Systems see hpi    Objective:   Physical Exam  Nursing note and vitals reviewed. Constitutional: She appears well-developed and well-nourished. No distress.  HENT:  Head: Normocephalic and atraumatic.  Right Ear: Tympanic membrane, external ear and ear canal normal.  Left Ear: External ear and ear canal normal.  Nose: Rhinorrhea present.  Mouth/Throat: Oropharynx is clear and moist. No oropharyngeal exudate.  Eyes: Conjunctivae are normal. No scleral icterus.  Neck: Neck supple.  Cardiovascular: Normal rate, regular rhythm and normal heart sounds.  Exam reveals no gallop and no friction rub.   No murmur heard. Pulmonary/Chest: Effort normal and breath sounds normal. No respiratory distress. She has no wheezes. She has no rales.  Abdominal: Soft. She exhibits no distension. There is no tenderness.  Lymphadenopathy:    She has no cervical adenopathy.  Neurological: She is alert.  Skin: Skin is warm and dry. She is not diaphoretic.          Assessment & Plan:   No problem-specific assessment & plan notes found for this encounter.

## 2010-09-11 NOTE — Assessment & Plan Note (Signed)
Begin levaquin to completion. Followup if no improvement or worsening.

## 2010-09-11 NOTE — Assessment & Plan Note (Signed)
Rechecked 148/100. Elevated during sickness and has not taken medication today. Asx. Monitor bp as outpatient and report results for review.

## 2010-09-11 NOTE — Assessment & Plan Note (Signed)
Abx as above. Followup if no improvement or worsening.

## 2010-10-01 ENCOUNTER — Other Ambulatory Visit: Payer: Self-pay | Admitting: Family Medicine

## 2010-10-03 ENCOUNTER — Ambulatory Visit (HOSPITAL_BASED_OUTPATIENT_CLINIC_OR_DEPARTMENT_OTHER)
Admission: RE | Admit: 2010-10-03 | Discharge: 2010-10-03 | Disposition: A | Discharge status: Home / Self Care | Payer: No Typology Code available for payment source | Source: Ambulatory Visit | Attending: Family Medicine | Admitting: Family Medicine

## 2010-10-03 ENCOUNTER — Ambulatory Visit (INDEPENDENT_AMBULATORY_CARE_PROVIDER_SITE_OTHER): Payer: No Typology Code available for payment source | Admitting: Family Medicine

## 2010-10-03 ENCOUNTER — Encounter: Payer: Self-pay | Admitting: Family Medicine

## 2010-10-03 VITALS — BP 194/133 | HR 80 | Temp 97.8°F | Ht 60.0 in | Wt 290.0 lb

## 2010-10-03 DIAGNOSIS — M25559 Pain in unspecified hip: Secondary | ICD-10-CM

## 2010-10-03 DIAGNOSIS — IMO0002 Reserved for concepts with insufficient information to code with codable children: Secondary | ICD-10-CM

## 2010-10-03 DIAGNOSIS — M25551 Pain in right hip: Secondary | ICD-10-CM

## 2010-10-03 DIAGNOSIS — M25469 Effusion, unspecified knee: Secondary | ICD-10-CM

## 2010-10-03 DIAGNOSIS — M1710 Unilateral primary osteoarthritis, unspecified knee: Secondary | ICD-10-CM

## 2010-10-03 DIAGNOSIS — N949 Unspecified condition associated with female genital organs and menstrual cycle: Secondary | ICD-10-CM

## 2010-10-03 DIAGNOSIS — M25562 Pain in left knee: Secondary | ICD-10-CM

## 2010-10-03 DIAGNOSIS — W19XXXA Unspecified fall, initial encounter: Secondary | ICD-10-CM

## 2010-10-03 DIAGNOSIS — M25552 Pain in left hip: Secondary | ICD-10-CM

## 2010-10-03 DIAGNOSIS — M928 Other specified juvenile osteochondrosis: Secondary | ICD-10-CM

## 2010-10-03 DIAGNOSIS — M25569 Pain in unspecified knee: Secondary | ICD-10-CM

## 2010-10-03 MED ORDER — MELOXICAM 15 MG PO TABS: 15.0000 mg | ORAL_TABLET | Freq: Every day | ORAL | Status: AC

## 2010-10-03 MED ORDER — CYCLOBENZAPRINE HCL 5 MG PO TABS: 5.0000 mg | ORAL_TABLET | Freq: Three times a day (TID) | ORAL | Status: AC | PRN

## 2010-10-03 MED ORDER — HUGO ROLLING WALKER MISC: 1.0000 | Freq: Once | Status: AC

## 2010-10-03 NOTE — Patient Instructions (Addendum)
Your x-rays of your knee and your hip joints show that you did not fracture a bone with your fall. The ligaments and the patellar tendon of you knee are ok also. You really flared up your arthritis with your fall. The tramadol should be at the pharmacy - this was sent in this morning with 2 refills. Ice your knee 15 minutes at a time 3-4 times a day. We are going to put in a referral to a different orthopedic doctor for your knee - I think you need a knee replacement at this point as the injections are not working and your quality of life is poor because of the pain. Your knee is putting a lot of stress on your other joints and your back as well. Start meloxicam 15mg  daily with food for pain and inflammation. Flexeril as needed for muscle spasms of your back.

## 2010-10-05 ENCOUNTER — Encounter: Payer: Self-pay | Admitting: Family Medicine

## 2010-10-05 DIAGNOSIS — M25552 Pain in left hip: Secondary | ICD-10-CM | POA: Insufficient documentation

## 2010-10-05 NOTE — Assessment & Plan Note (Signed)
x-rays show minimal DJD.  Believe this is more related to left low back strain that has occurred from gait change due to severe left knee pain.  Meds as noted in #1.  No symptoms of radiculopathy.

## 2010-10-05 NOTE — Assessment & Plan Note (Signed)
X-rays show no evidence of fracture, ligaments intact on exam, and ultrasound confirms patellar tendon is intact.  2/2 exacerbation of her severe DJD.  Injections were only providing a few days of relief - patient declined one today.  Viscosupplementation also only provided 1 week of relief.  Tramadol, mobic, icing.  Rx given for a walker.  We previously also discussed glucosamine, quad strengthening.  Will refer to ortho to consider knee replacement surgery.

## 2010-10-05 NOTE — Progress Notes (Signed)
Subjective:    Patient ID: Faith [name], female    DOB: February 07, 1966, 45 y.o.   MRN: [id number]  HPI 45 yo F here with L knee and L hip/back pain.  01/19/10: Patient denies any known injury  Has had several year history of L > R knee pain worse with ambulation  Pain now even bad when sitting, soaking in tub, otherwise at rest.  Remotely had x-rays showing 'bone on bone' arthritis.  Has also had injections (cortisone and visco) which helped initially but when had them most recently > 1 year ago only lasted about a week.  Tried aleve, tylenol, ibuprofen without help  Icing and heat at bedtime.  Taking tramadol now for pain  Some catching and does give out because of pain  No prior knee surgeries  Just took her blood pressure medication prior to coming into our office - no chest pain, dyspnea, other complaints.   8/21: Patient states on 8/20 when getting into a truck, she fell with her left leg behind her, hyperflexing left knee. + pain, swelling per patient. Very difficult to bend knee due to pain. Has known end stage DJD of bilateral knees though left has bothered her more than her right knee. No true catching or locking.  Knee feels unstable. For past several months has had left lateral hip, lower back pain that started after knee pain. No numbness/tingling. No bowel/bladder dysfunction. Does get some groin pain.  Past Medical History  Diagnosis Date  . Hyperlipidemia   . Hypertension   . Morbid obesity   . Osteoarthritis, chronic     bilateral knees  . Diabetes mellitus     Current Outpatient Prescriptions on File Prior to Visit  Medication Sig Dispense Refill  . ALPRAZolam (XANAX) 0.5 MG tablet 1 tablet by mouth 1/2 hour before MRI  5 tablet  0  . cloNIDine (CATAPRES) 0.1 MG tablet Take 0.1 mg by mouth 2 (two) times daily.        . hydrochlorothiazide (HYDRODIURIL) 12.5 MG tablet Take 2 tablets (25 mg total) by mouth daily.  30 tablet  1  . losartan (COZAAR) 100 MG tablet  Take 1 tablet (100 mg total) by mouth daily.  30 tablet  1  . metFORMIN (GLUCOPHAGE-XR) 500 MG 24 hr tablet TAKE ONE (1) TABLET(S) TWICE DAILY  60 tablet  5  . metoprolol (TOPROL-XL) 50 MG 24 hr tablet Take 1 tablet daily  30 tablet  3  . pravastatin (PRAVACHOL) 40 MG tablet Take 40 mg by mouth daily.        . traMADol (ULTRAM) 50 MG tablet Take 50 mg by mouth 3 (three) times daily as needed.          Past Surgical History  Procedure Date  . Total abdominal hysterectomy   . Tubal ligation     No Known Allergies  History   Social History  . Marital Status: Single    Spouse Name: N/A    Number of Children: N/A  . Years of Education: N/A   Occupational History  . Not on file.   Social History Main Topics  . Smoking status: Never Smoker   . Smokeless tobacco: Not on file  . Alcohol Use: No  . Drug Use: No  . Sexually Active: Not on file   Other Topics Concern  . 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 Hypertension Father   . Anuerysm Mother 71    mother died in 47 at age 65 from brain aneurysm  . Other      estranged from father    BP 194/133  Pulse 80  Temp(Src) 97.8 F (36.6 C) (Oral)  Ht 5' (1.524 m)  Wt 290 lb (131.543 kg)  BMI 56.64 kg/m2  Review of Systems See HPI above.    Objective:   Physical Exam General: alert and obese   L knee:  + soft tissue swelling but no obvious effusion (difficult to discern). No warmth, redness.  Diffuse TTP anteriorly including medial & lateral joint lines, post patellar facets, patellar tendon area.  ROM 0-90 degrees with pain through entire ROM  Negative ant/post drawers. Stable to valgus/varus stress  Pain but no click with mcmurrays. NVI distally.   L hip: FROM without pain in groin.  Diffuse lateral  and left low back TTP.  MSK u/s: Patellar tendon is intact left knee.    Assessment & Plan:  1. L knee pain - X-rays show no evidence of fracture, ligaments intact on exam, and ultrasound confirms patellar tendon is intact.  2/2 exacerbation of her severe DJD.  Injections were only providing a few days of relief - patient declined one today.  Viscosupplementation also only provided 1 week of relief.  Tramadol, mobic, icing.  Rx given for a walker.  We previously also discussed glucosamine, quad strengthening.  Will refer to ortho to consider knee replacement surgery.  2. Left hip pain - x-rays show minimal DJD.  Believe this is more related to left low back strain that has occurred from gait change due to severe left knee pain.  Meds as noted in #1.  No symptoms of radiculopathy.

## 2010-11-06 ENCOUNTER — Telehealth: Payer: Self-pay | Admitting: Internal Medicine

## 2010-11-06 MED ORDER — HYDROCHLOROTHIAZIDE 12.5 MG PO TABS: 25.0000 mg | ORAL_TABLET | Freq: Every day | ORAL | Status: DC

## 2010-11-06 NOTE — Telephone Encounter (Signed)
Refill- hydrochlorothiazide oral capsule 12.5mg . Take two capsules by mouth daily. Qty 30. Last fill 8.16.12

## 2010-11-22 ENCOUNTER — Ambulatory Visit: Payer: No Typology Code available for payment source | Admitting: Internal Medicine

## 2010-11-22 DIAGNOSIS — Z0289 Encounter for other administrative examinations: Secondary | ICD-10-CM

## 2010-11-30 ENCOUNTER — Ambulatory Visit: Payer: No Typology Code available for payment source | Admitting: Internal Medicine

## 2010-12-05 ENCOUNTER — Ambulatory Visit: Payer: No Typology Code available for payment source | Admitting: Internal Medicine

## 2010-12-11 ENCOUNTER — Ambulatory Visit: Payer: No Typology Code available for payment source | Admitting: Internal Medicine

## 2010-12-23 ENCOUNTER — Other Ambulatory Visit: Payer: Self-pay | Admitting: Internal Medicine

## 2010-12-25 NOTE — Telephone Encounter (Signed)
Rx refill sent to pharmacy. Office visit is needed.

## 2010-12-26 ENCOUNTER — Other Ambulatory Visit: Payer: Self-pay | Admitting: *Deleted

## 2010-12-26 DIAGNOSIS — I10 Essential (primary) hypertension: Secondary | ICD-10-CM

## 2010-12-26 MED ORDER — HYDROCHLOROTHIAZIDE 12.5 MG PO CAPS: 12.5000 mg | ORAL_CAPSULE | ORAL | Status: DC

## 2010-12-26 MED ORDER — METOPROLOL SUCCINATE ER 50 MG PO TB24: 50.0000 mg | ORAL_TABLET | Freq: Every day | ORAL | Status: DC

## 2014-03-24 ENCOUNTER — Emergency Department (HOSPITAL_BASED_OUTPATIENT_CLINIC_OR_DEPARTMENT_OTHER): Payer: Medicaid - Out of State

## 2014-03-24 ENCOUNTER — Encounter (HOSPITAL_BASED_OUTPATIENT_CLINIC_OR_DEPARTMENT_OTHER): Payer: Self-pay | Admitting: *Deleted

## 2014-03-24 ENCOUNTER — Emergency Department (HOSPITAL_BASED_OUTPATIENT_CLINIC_OR_DEPARTMENT_OTHER)
Admission: EM | Admit: 2014-03-24 | Discharge: 2014-03-24 | Disposition: A | Discharge status: Home / Self Care | Payer: Medicaid - Out of State | Attending: Emergency Medicine | Admitting: Emergency Medicine

## 2014-03-24 DIAGNOSIS — E785 Hyperlipidemia, unspecified: Secondary | ICD-10-CM | POA: Diagnosis not present

## 2014-03-24 DIAGNOSIS — Z79899 Other long term (current) drug therapy: Secondary | ICD-10-CM | POA: Insufficient documentation

## 2014-03-24 DIAGNOSIS — R1013 Epigastric pain: Secondary | ICD-10-CM | POA: Insufficient documentation

## 2014-03-24 DIAGNOSIS — R109 Unspecified abdominal pain: Secondary | ICD-10-CM | POA: Diagnosis present

## 2014-03-24 DIAGNOSIS — K838 Other specified diseases of biliary tract: Secondary | ICD-10-CM | POA: Insufficient documentation

## 2014-03-24 DIAGNOSIS — Z8739 Personal history of other diseases of the musculoskeletal system and connective tissue: Secondary | ICD-10-CM | POA: Insufficient documentation

## 2014-03-24 DIAGNOSIS — R112 Nausea with vomiting, unspecified: Secondary | ICD-10-CM

## 2014-03-24 DIAGNOSIS — I10 Essential (primary) hypertension: Secondary | ICD-10-CM | POA: Diagnosis not present

## 2014-03-24 DIAGNOSIS — E119 Type 2 diabetes mellitus without complications: Secondary | ICD-10-CM | POA: Diagnosis not present

## 2014-03-24 LAB — CBC WITH DIFFERENTIAL/PLATELET
BASOS ABS: 0 10*3/uL (ref 0.0–0.1)
BASOS PCT: 0 % (ref 0–1)
EOS ABS: 0.1 10*3/uL (ref 0.0–0.7)
EOS PCT: 1 % (ref 0–5)
HEMATOCRIT: 36.3 % (ref 36.0–46.0)
Hemoglobin: 12.1 g/dL (ref 12.0–15.0)
LYMPHS PCT: 23 % (ref 12–46)
Lymphs Abs: 1.8 10*3/uL (ref 0.7–4.0)
MCH: 30.9 pg (ref 26.0–34.0)
MCHC: 33.3 g/dL (ref 30.0–36.0)
MCV: 92.6 fL (ref 78.0–100.0)
MONO ABS: 0.3 10*3/uL (ref 0.1–1.0)
Monocytes Relative: 4 % (ref 3–12)
Neutro Abs: 5.5 10*3/uL (ref 1.7–7.7)
Neutrophils Relative %: 72 % (ref 43–77)
PLATELETS: 360 10*3/uL (ref 150–400)
RBC: 3.92 MIL/uL (ref 3.87–5.11)
RDW: 12.4 % (ref 11.5–15.5)
WBC: 7.7 10*3/uL (ref 4.0–10.5)

## 2014-03-24 LAB — URINALYSIS, ROUTINE W REFLEX MICROSCOPIC
Glucose, UA: NEGATIVE mg/dL
HGB URINE DIPSTICK: NEGATIVE
KETONES UR: 15 mg/dL — AB
NITRITE: NEGATIVE
PH: 5.5 (ref 5.0–8.0)
Protein, ur: 30 mg/dL — AB
Specific Gravity, Urine: 1.03 (ref 1.005–1.030)
UROBILINOGEN UA: 1 mg/dL (ref 0.0–1.0)

## 2014-03-24 LAB — COMPREHENSIVE METABOLIC PANEL
ALT: 19 U/L (ref 0–35)
AST: 25 U/L (ref 0–37)
Albumin: 4 g/dL (ref 3.5–5.2)
Alkaline Phosphatase: 76 U/L (ref 39–117)
Anion gap: 5 (ref 5–15)
BUN: 14 mg/dL (ref 6–23)
CALCIUM: 9 mg/dL (ref 8.4–10.5)
CO2: 27 mmol/L (ref 19–32)
CREATININE: 1.18 mg/dL — AB (ref 0.50–1.10)
Chloride: 107 mmol/L (ref 96–112)
GFR, EST AFRICAN AMERICAN: 61 mL/min — AB (ref >90)
GFR, EST NON AFRICAN AMERICAN: 53 mL/min — AB (ref >90)
GLUCOSE: 112 mg/dL — AB (ref 70–99)
POTASSIUM: 3.7 mmol/L (ref 3.5–5.1)
SODIUM: 139 mmol/L (ref 135–145)
Total Bilirubin: 0.6 mg/dL (ref 0.3–1.2)
Total Protein: 7.5 g/dL (ref 6.0–8.3)

## 2014-03-24 LAB — TROPONIN I: Troponin I: <0.03 ng/mL (ref <0.031)

## 2014-03-24 LAB — URINE MICROSCOPIC-ADD ON

## 2014-03-24 LAB — LIPASE, BLOOD: Lipase: 40 U/L (ref 11–59)

## 2014-03-24 MED ORDER — HYDROMORPHONE HCL 1 MG/ML IJ SOLN
1.0000 mg | Freq: Once | INTRAMUSCULAR | Status: AC
  Administered 2014-03-24: 1 mg via INTRAVENOUS
  Filled 2014-03-24: qty 1

## 2014-03-24 MED ORDER — TRAMADOL HCL 50 MG PO TABS: 50.0000 mg | ORAL_TABLET | Freq: Four times a day (QID) | ORAL | Status: DC | PRN

## 2014-03-24 MED ORDER — ONDANSETRON HCL 8 MG PO TABS: 8.0000 mg | ORAL_TABLET | ORAL | Status: DC | PRN

## 2014-03-24 MED ORDER — MORPHINE SULFATE 4 MG/ML IJ SOLN
4.0000 mg | Freq: Once | INTRAMUSCULAR | Status: AC
  Administered 2014-03-24: 4 mg via INTRAVENOUS
  Filled 2014-03-24: qty 1

## 2014-03-24 MED ORDER — IOHEXOL 300 MG/ML  SOLN
25.0000 mL | Freq: Once | INTRAMUSCULAR | Status: AC | PRN
  Administered 2014-03-24: 25 mL via ORAL

## 2014-03-24 MED ORDER — ONDANSETRON HCL 4 MG/2ML IJ SOLN
4.0000 mg | Freq: Once | INTRAMUSCULAR | Status: AC
  Administered 2014-03-24: 4 mg via INTRAVENOUS
  Filled 2014-03-24: qty 2

## 2014-03-24 MED ORDER — IOHEXOL 300 MG/ML  SOLN
100.0000 mL | Freq: Once | INTRAMUSCULAR | Status: AC | PRN
  Administered 2014-03-24: 100 mL via INTRAVENOUS

## 2014-03-24 NOTE — Discharge Instructions (Signed)
1. Medications: zofran, tramadol, usual home medications 2. Treatment: rest, drink plenty of fluids, advance diet slowly 3. Follow Up: Please followup with your Gastroenterology in 2 days for discussion of your diagnoses and further evaluation after today's visit; if you do not have a primary care doctor use the resource guide provided to find one; Please return to the ER for persistent vomiting, high fevers or worsening symptoms    Abdominal Pain Many things can cause abdominal pain. Usually, abdominal pain is not caused by a disease and will improve without treatment. It can often be observed and treated at home. Your health care provider will do a physical exam and possibly order blood tests and X-rays to help determine the seriousness of your pain. However, in many cases, more time must pass before a clear cause of the pain can be found. Before that point, your health care provider may not know if you need more testing or further treatment. HOME CARE INSTRUCTIONS  Monitor your abdominal pain for any changes. The following actions may help to alleviate any discomfort you are experiencing:  Only take over-the-counter or prescription medicines as directed by your health care provider.  Do not take laxatives unless directed to do so by your health care provider.  Try a clear liquid diet (broth, tea, or water) as directed by your health care provider. Slowly move to a bland diet as tolerated. SEEK MEDICAL CARE IF:  You have unexplained abdominal pain.  You have abdominal pain associated with nausea or diarrhea.  You have pain when you urinate or have a bowel movement.  You experience abdominal pain that wakes you in the night.  You have abdominal pain that is worsened or improved by eating food.  You have abdominal pain that is worsened with eating fatty foods.  You have a fever. SEEK IMMEDIATE MEDICAL CARE IF:   Your pain does not go away within 2 hours.  You keep throwing up  (vomiting).  Your pain is felt only in portions of the abdomen, such as the right side or the left lower portion of the abdomen.  You pass bloody or black tarry stools. MAKE SURE YOU:  Understand these instructions.   Will watch your condition.   Will get help right away if you are not doing well or get worse.  Document Released: 11/08/2004 Document Revised: 02/03/2013 Document Reviewed: 10/08/2012 [facility] Patient Information 2015 [employer], [place]. This information is not intended to replace advice given to you by your health care provider. Make sure you discuss any questions you have with your health care provider.

## 2014-03-24 NOTE — ED Notes (Signed)
Patient transported to CT 

## 2014-03-24 NOTE — ED Provider Notes (Signed)
CSN: [id number]     Arrival date & time 03/24/14  1244 History   First MD Initiated Contact with Patient 03/24/14 1401     Chief Complaint  Patient presents with  . Abdominal Pain     (Consider location/radiation/quality/duration/timing/severity/associated sxs/prior Treatment) The history is provided by the patient and medical records. No language interpreter was used.     Faith [name] is a 49 y.o. female  with a hx of HTN, obesity, NIDDM presents to the Emergency Department complaining of gradual, persistent, progressively worsening epigastric abd pain onset 4 days ago.  Pt reports she has a cramping and squeezing pain in the epigastrium rated at a 10/10.  She reports trying Tylenol without relief.  Pt reports that drinking and eating makes it worse.  Pt reports pain is similar to her gallstones but had a cholecystectomy in May 2014.  Pt reports no problems since that time.  She reports intermittent vomiting with 3-4 episodes in the last 4 days.  Emesis is NBNB.  She denies diarrhea, melena or hematochezia. Pt denies sick contacts.  Pt denies fever, chills, headache, neck pain, chest pain, SOB, diarrhea, weakness, dizziness, syncope, diaphoresis, dysuria.  Pt denise taking ASA or other NSAIDs.  Pt reports she had a gastric bypass in 2013 in [place], [place] by Dr.  Parks Neptune.    Past Medical History  Diagnosis Date  . Hyperlipidemia   . Hypertension   . Morbid obesity   . Osteoarthritis, chronic     bilateral knees  . Diabetes mellitus    Past Surgical History  Procedure Laterality Date  . Total abdominal hysterectomy    . Tubal ligation    . Total knee arthroplasty    . Cholecystectomy    . Gastric bypass     Family History  Problem Relation Age of Onset  . Hypertension Father   . Anuerysm Mother 25    mother died in 61 at age 12 from brain aneurysm  . Other      estranged from father   History  Substance Use Topics  . Smoking status: Never Smoker   . Smokeless tobacco:  Not on file  . Alcohol Use: No   OB History    No data available     Review of Systems  Constitutional: Negative for fever, diaphoresis, appetite change, fatigue and unexpected weight change.  HENT: Negative for mouth sores and trouble swallowing.   Respiratory: Negative for cough, chest tightness, shortness of breath, wheezing and stridor.   Cardiovascular: Negative for chest pain and palpitations.  Gastrointestinal: Positive for nausea, vomiting and abdominal pain. Negative for diarrhea, constipation, blood in stool, abdominal distention and rectal pain.  Genitourinary: Negative for dysuria, urgency, frequency, hematuria, flank pain and difficulty urinating.  Musculoskeletal: Negative for back pain, neck pain and neck stiffness.  Skin: Negative for rash.  Neurological: Negative for weakness.  Hematological: Negative for adenopathy.  Psychiatric/Behavioral: Negative for confusion.  All other systems reviewed and are negative.     Allergies  Percocet  Home Medications   Prior to Admission medications   Medication Sig Start Date End Date Taking? Authorizing Provider  amLODipine (NORVASC) 5 MG tablet Take 5 mg by mouth daily.   Yes Historical Provider, MD  lisinopril (PRINIVIL,ZESTRIL) 20 MG tablet Take 20 mg by mouth daily.   Yes Historical Provider, MD  Multiple Vitamin (MULTIVITAMIN) tablet Take 1 tablet by mouth daily.   Yes Historical Provider, MD  ALPRAZolam Prudy Feeler) 0.5 MG tablet 1 tablet by  mouth 1/2 hour before MRI 06/11/10   Doe-Hyun R Artist PaisYoo, DO  cloNIDine (CATAPRES) 0.1 MG tablet Take 0.1 mg by mouth 2 (two) times daily.      Historical Provider, MD  hydrochlorothiazide (MICROZIDE) 12.5 MG capsule Take 1 capsule (12.5 mg total) by mouth every morning. 12/26/10   Doe-Hyun R Artist PaisYoo, DO  losartan (COZAAR) 100 MG tablet Take 1 tablet (100 mg total) by mouth daily. 08/17/10 08/17/11  Edwyna Perfecthomas W Hodgin, MD  metFORMIN (GLUCOPHAGE-XR) 500 MG 24 hr tablet TAKE ONE (1) TABLET(S) TWICE DAILY  08/31/10   Edwyna Perfecthomas W Hodgin, MD  metoprolol (TOPROL-XL) 50 MG 24 hr tablet Take 1 tablet (50 mg total) by mouth daily. 12/26/10   Doe-Hyun Sherran Needs Yoo, DO  Misc. Devices (HUGO ROLLING WALKER) MISC 1 Device by Does not apply route once. 10/03/10   Lenda KelpShane R Hudnall, MD  ondansetron (ZOFRAN) 8 MG tablet Take 1 tablet (8 mg total) by mouth every 4 (four) hours as needed for nausea. 03/24/14   Evangelyne Loja, PA-C  pravastatin (PRAVACHOL) 40 MG tablet Take 40 mg by mouth daily.      Historical Provider, MD  traMADol (ULTRAM) 50 MG tablet Take 1 tablet (50 mg total) by mouth every 6 (six) hours as needed. 03/24/14   Paisyn Guercio, PA-C   BP 139/82 mmHg  Pulse 75  Temp(Src) 99.7 F (37.6 C) (Oral)  Resp 18  Ht 5' (1.524 m)  Wt 200 lb (90.719 kg)  BMI 39.06 kg/m2  SpO2 99% Physical Exam  Constitutional: She appears well-developed and well-nourished. No distress.  Awake, alert, nontoxic appearance  HENT:  Head: Normocephalic and atraumatic.  Mouth/Throat: Oropharynx is clear and moist. No oropharyngeal exudate.  Eyes: Conjunctivae are normal. No scleral icterus.  Neck: Normal range of motion. Neck supple.  Cardiovascular: Normal rate, regular rhythm, normal heart sounds and intact distal pulses.   No murmur heard. Pulmonary/Chest: Effort normal and breath sounds normal. No respiratory distress. She has no wheezes.  Equal chest expansion  Abdominal: Soft. Bowel sounds are normal. She exhibits no distension and no mass. There is tenderness in the epigastric area. There is guarding and positive Murphy's sign. There is no rebound, no CVA tenderness and no tenderness at McBurney's point.  Tenderness to palpation epigastrium with mild guarding No CVA tenderness  Musculoskeletal: Normal range of motion. She exhibits no edema.  Neurological: She is alert.  Speech is clear and goal oriented Moves extremities without ataxia  Skin: Skin is warm and dry. She is not diaphoretic.  Psychiatric: She has a  normal mood and affect.  Nursing note and vitals reviewed.   ED Course  Procedures (including critical care time) Labs Review Labs Reviewed  URINALYSIS, ROUTINE W REFLEX MICROSCOPIC - Abnormal; Notable for the following:    Color, Urine AMBER (*)    APPearance CLOUDY (*)    Bilirubin Urine MODERATE (*)    Ketones, ur 15 (*)    Protein, ur 30 (*)    Leukocytes, UA TRACE (*)    All other components within normal limits  URINE MICROSCOPIC-ADD ON - Abnormal; Notable for the following:    Squamous Epithelial / LPF FEW (*)    Bacteria, UA FEW (*)    Casts HYALINE CASTS (*)    All other components within normal limits  COMPREHENSIVE METABOLIC PANEL - Abnormal; Notable for the following:    Glucose, Bld 112 (*)    Creatinine, Ser 1.18 (*)    GFR calc non Af Amer 53 (*)  GFR calc Af Amer 61 (*)    All other components within normal limits  CBC WITH DIFFERENTIAL/PLATELET  LIPASE, BLOOD  TROPONIN I    Imaging Review Ct Abdomen Pelvis W Contrast  03/24/2014   CLINICAL DATA:  49 year old female Edit with 4 day history of mid abdominal pain accompanied by nausea and vomiting  EXAM: CT ABDOMEN AND PELVIS WITH CONTRAST  TECHNIQUE: Multidetector CT imaging of the abdomen and pelvis was performed using the standard protocol following bolus administration of intravenous contrast.  CONTRAST:  25mL OMNIPAQUE IOHEXOL 300 MG/ML SOLN, OMNIPAQUE IOHEXOL 300 MG/ML SOLN  COMPARISON:  Prior CT abdomen/ pelvis 08/10/2009  FINDINGS: Lower Chest: The lung bases are clear. Visualized cardiac structures are within normal limits for size. No pericardial effusion. Small hiatal hernia.  Abdomen: Surgical changes of prior gastric bypass procedure. Unremarkable appearance of duodenum, spleen, adrenal glands and pancreas. Normal hepatic contour and morphology. No discrete hepatic lesion. The gallbladder is surgically absent. Mild intra and extrahepatic biliary ductal dilatation. The common bile duct is dilated to  12 mm at the pancreatic head. This represents an interval change compared to November of 2011. No choledocholithiasis or distal mass identified. No associated dilatation of the main pancreatic duct.  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 No evidence of hydronephrosis, nephrolithiasis or enhancing mass. Subcentimeter low-attenuation lesion in the lateral aspect of the interpolar left kidney is too small for accurate characterization but statistically likely a benign cyst.  No evidence of a bowel obstruction or focal bowel wall thickening. Normal appendix in the right lower quadrant. No evidence of free fluid or suspicious adenopathy.  Pelvis: Surgical changes of prior hysterectomy. The bladder is decompressed. Mild pelvic floor laxity. No free fluid or adenopathy.  Bones/Soft Tissues: No acute fracture or aggressive appearing lytic or blastic osseous lesion. Multilevel lower lumbar facet arthropathy and degenerative disc disease.  Vascular: No significant atherosclerotic vascular disease, aneurysmal dilatation or acute abnormality.  IMPRESSION: 1. Interval development of mild intrahepatic and moderate extrahepatic biliary ductal dilatation. The common bile duct measures up to with abrupt tapering at the ampulla. No evidence of choledocholithiasis or distal obstructing mass. Recommend clinical correlation with serum bili Rubin. If there is evidence of obstruction, consider further evaluation with MRCP. 2. Surgical changes of prior gastric bypass procedure, cholecystectomy and hysterectomy. 3. Mild pelvic floor laxity. 4. Malrotated left kidney noted incidentally.   Electronically Signed   By: Malachy Moan M.D.   On: 03/24/2014 15:54     EKG Interpretation   Date/Time:  Wednesday March 24 2014 14:57:05 EST Ventricular Rate:  61 PR Interval:  208 QRS Duration: 78 QT Interval:  432 QTC Calculation: 434 R Axis:   6 Text Interpretation:  Normal sinus rhythm Minimal  voltage criteria for  LVH, may be normal variant Borderline ECG no significant change since 2012  Confirmed by GOLDSTON  MD, SCOTT ([id number]) on 03/24/2014 3:49:46 PM      MDM   Final diagnoses:  Abdominal pain, epigastric  Non-intractable vomiting with nausea, vomiting of unspecified type  Dilated intrahepatic bile duct  Dilated bile duct   Ceasar Mons presents with 4 days of epigastric abd pain.  Patient with history of Roux-en-Y gastric bypass and subsequent cholecystectomy. She reports the last time she had pain like this it was secondary to gallstones requiring her cholecystectomy. She denies fevers or chills. She is afebrile here and without tachycardia however she does have tenderness in the epigastrium. Will obtain labs,  EKG, CT scan and reassess. Concern for possible reaccumulation in the common bile duct versus internal hernia.  4:30PM Pt without elevation in AST, ALT, lipase or total bili. Troponin negative and EKG nonischemic.  Labs otherwise reassuring. Patient has been given a fluid bolus. She reports persistent pain. Will re-dose pain medication and reassess.  5:30PM Patient feeling some better. We'll by mouth trial.  CT scan with mild intrahepatic and moderate extrahepatic biliary ductal dilation to 12 mm at the pancreatic head. No evidence of choledocholithiasis or distal mass. No dilation of the main pancreatic duct.  6:07 PM Pt with continued but improved pain, but no emesis after PO trial in the department.  She wishes for d/c home.  Will consult with GI to scheduled outpatient f/u.    7:22 PM Discussed with Dr. Russella Dar who will be able to follow her in the outpatient setting.  Patient to be discharged home with Zofran. She reports she is allergic to Percocet, Darvocet and Vicodin. She requests discharge home with tramadol for pain. She is alert, oriented, nontoxic and nonseptic appearing. Her abdomen remained soft with minimal tenderness in epigastrium but no rebound.  I  have personally reviewed patient's vitals, nursing note and any pertinent labs or imaging.  I performed an undressed physical exam.    It has been determined that no acute conditions requiring further emergency intervention are present at this time. The patient/guardian have been advised of the diagnosis and plan. I reviewed all labs and imaging including any potential incidental findings. We have discussed signs and symptoms that warrant return to the ED and they are listed in the discharge instructions.    Vital signs are stable at discharge.   BP 139/82 mmHg  Pulse 75  Temp(Src) 99.7 F (37.6 C) (Oral)  Resp 18  Ht 5' (1.524 m)  Wt 200 lb (90.719 kg)  BMI 39.06 kg/m2  SpO2 99%   The patient was discussed with and seen by Dr. Criss Alvine who agrees with the treatment plan.     Dahlia Client Franz Svec, PA-C 03/24/14 1943  Audree Camel, MD 03/25/14 [phone]

## 2014-03-24 NOTE — ED Notes (Signed)
Pt c/o abd pain that began 3 days ago and became worse last night. She sts eating makes the pain worse.

## 2014-12-23 ENCOUNTER — Emergency Department (HOSPITAL_BASED_OUTPATIENT_CLINIC_OR_DEPARTMENT_OTHER): Payer: No Typology Code available for payment source

## 2014-12-23 ENCOUNTER — Emergency Department (HOSPITAL_BASED_OUTPATIENT_CLINIC_OR_DEPARTMENT_OTHER)
Admission: EM | Admit: 2014-12-23 | Discharge: 2014-12-23 | Disposition: A | Discharge status: Home / Self Care | Payer: Self-pay | Attending: Emergency Medicine | Admitting: Emergency Medicine

## 2014-12-23 ENCOUNTER — Encounter (HOSPITAL_BASED_OUTPATIENT_CLINIC_OR_DEPARTMENT_OTHER): Payer: Self-pay

## 2014-12-23 DIAGNOSIS — M25561 Pain in right knee: Secondary | ICD-10-CM | POA: Insufficient documentation

## 2014-12-23 DIAGNOSIS — E119 Type 2 diabetes mellitus without complications: Secondary | ICD-10-CM | POA: Insufficient documentation

## 2014-12-23 DIAGNOSIS — Z9049 Acquired absence of other specified parts of digestive tract: Secondary | ICD-10-CM | POA: Insufficient documentation

## 2014-12-23 DIAGNOSIS — M25562 Pain in left knee: Secondary | ICD-10-CM | POA: Insufficient documentation

## 2014-12-23 DIAGNOSIS — E785 Hyperlipidemia, unspecified: Secondary | ICD-10-CM | POA: Insufficient documentation

## 2014-12-23 DIAGNOSIS — Z79899 Other long term (current) drug therapy: Secondary | ICD-10-CM | POA: Insufficient documentation

## 2014-12-23 DIAGNOSIS — I10 Essential (primary) hypertension: Secondary | ICD-10-CM | POA: Insufficient documentation

## 2014-12-23 DIAGNOSIS — K859 Acute pancreatitis without necrosis or infection, unspecified: Secondary | ICD-10-CM | POA: Insufficient documentation

## 2014-12-23 DIAGNOSIS — M1990 Unspecified osteoarthritis, unspecified site: Secondary | ICD-10-CM | POA: Insufficient documentation

## 2014-12-23 DIAGNOSIS — Z9884 Bariatric surgery status: Secondary | ICD-10-CM | POA: Insufficient documentation

## 2014-12-23 DIAGNOSIS — Z7984 Long term (current) use of oral hypoglycemic drugs: Secondary | ICD-10-CM | POA: Insufficient documentation

## 2014-12-23 LAB — CBC WITH DIFFERENTIAL/PLATELET
BASOS ABS: 0 10*3/uL (ref 0.0–0.1)
Basophils Relative: 1 %
Eosinophils Absolute: 0.2 10*3/uL (ref 0.0–0.7)
Eosinophils Relative: 3 %
HCT: 34.1 % — ABNORMAL LOW (ref 36.0–46.0)
HEMOGLOBIN: 11.2 g/dL — AB (ref 12.0–15.0)
Lymphocytes Relative: 41 %
Lymphs Abs: 2.1 10*3/uL (ref 0.7–4.0)
MCH: 30.8 pg (ref 26.0–34.0)
MCHC: 32.8 g/dL (ref 30.0–36.0)
MCV: 93.7 fL (ref 78.0–100.0)
Monocytes Absolute: 0.3 10*3/uL (ref 0.1–1.0)
Monocytes Relative: 6 %
NEUTROS PCT: 49 %
Neutro Abs: 2.6 10*3/uL (ref 1.7–7.7)
PLATELETS: 343 10*3/uL (ref 150–400)
RBC: 3.64 MIL/uL — ABNORMAL LOW (ref 3.87–5.11)
RDW: 12.7 % (ref 11.5–15.5)
WBC: 5.2 10*3/uL (ref 4.0–10.5)

## 2014-12-23 LAB — COMPREHENSIVE METABOLIC PANEL
ALT: 27 U/L (ref 14–54)
AST: 29 U/L (ref 15–41)
Albumin: 4.5 g/dL (ref 3.5–5.0)
Alkaline Phosphatase: 75 U/L (ref 38–126)
Anion gap: 6 (ref 5–15)
BUN: 17 mg/dL (ref 6–20)
CHLORIDE: 105 mmol/L (ref 101–111)
CO2: 28 mmol/L (ref 22–32)
Calcium: 9.4 mg/dL (ref 8.9–10.3)
Creatinine, Ser: 0.99 mg/dL (ref 0.44–1.00)
GFR calc Af Amer: >60 mL/min (ref >60)
Glucose, Bld: 85 mg/dL (ref 65–99)
POTASSIUM: 3.9 mmol/L (ref 3.5–5.1)
SODIUM: 139 mmol/L (ref 135–145)
Total Bilirubin: 0.7 mg/dL (ref 0.3–1.2)
Total Protein: 7.5 g/dL (ref 6.5–8.1)

## 2014-12-23 LAB — URINALYSIS, ROUTINE W REFLEX MICROSCOPIC
Bilirubin Urine: NEGATIVE
Glucose, UA: NEGATIVE mg/dL
Hgb urine dipstick: NEGATIVE
KETONES UR: NEGATIVE mg/dL
LEUKOCYTES UA: NEGATIVE
NITRITE: NEGATIVE
PROTEIN: NEGATIVE mg/dL
Specific Gravity, Urine: 1.027 (ref 1.005–1.030)
UROBILINOGEN UA: 1 mg/dL (ref 0.0–1.0)
pH: 5.5 (ref 5.0–8.0)

## 2014-12-23 LAB — LIPASE, BLOOD: LIPASE: 164 U/L — AB (ref 11–51)

## 2014-12-23 MED ORDER — IOHEXOL 300 MG/ML  SOLN
25.0000 mL | Freq: Once | INTRAMUSCULAR | Status: AC | PRN
  Administered 2014-12-23: 25 mL via ORAL

## 2014-12-23 MED ORDER — TRAMADOL HCL 50 MG PO TABS
50.0000 mg | ORAL_TABLET | Freq: Four times a day (QID) | ORAL | Status: DC | PRN
Start: 2014-12-23 — End: 2015-06-30

## 2014-12-23 MED ORDER — SODIUM CHLORIDE 0.9 % IV BOLUS (SEPSIS)
500.0000 mL | Freq: Once | INTRAVENOUS | Status: AC
  Administered 2014-12-23: 500 mL via INTRAVENOUS

## 2014-12-23 MED ORDER — IOHEXOL 300 MG/ML  SOLN
100.0000 mL | Freq: Once | INTRAMUSCULAR | Status: AC | PRN
  Administered 2014-12-23: 100 mL via INTRAVENOUS

## 2014-12-23 MED ORDER — MORPHINE SULFATE (PF) 4 MG/ML IV SOLN
4.0000 mg | Freq: Once | INTRAVENOUS | Status: AC
Start: 2014-12-23 — End: 2014-12-23
  Administered 2014-12-23: 4 mg via INTRAVENOUS
  Filled 2014-12-23: qty 1

## 2014-12-23 MED ORDER — ONDANSETRON HCL 4 MG/2ML IJ SOLN
4.0000 mg | Freq: Once | INTRAMUSCULAR | Status: AC
  Administered 2014-12-23: 4 mg via INTRAVENOUS
  Filled 2014-12-23: qty 2

## 2014-12-23 MED ORDER — PROMETHAZINE HCL 25 MG PO TABS
25.0000 mg | ORAL_TABLET | Freq: Four times a day (QID) | ORAL | Status: DC | PRN
Start: 2014-12-23 — End: 2020-11-24

## 2014-12-23 NOTE — Discharge Instructions (Signed)

## 2014-12-23 NOTE — ED Notes (Signed)
Reports abdominal pain that started 3 days ago.  Reports leg pain that started earlier today.

## 2014-12-23 NOTE — ED Provider Notes (Signed)
CSN: [id number]     Arrival date & time 12/23/14  1920 History   First MD Initiated Contact with Patient 12/23/14 1954     Chief Complaint  Patient presents with  . Abdominal Pain  . Knee Pain     (Consider location/radiation/quality/duration/timing/severity/associated sxs/prior Treatment) HPI Comments: Pt comes in with c/o nausea and epigastric pain. She states that she has had the symptoms since she had gastric bypass several year ago. She states that the pain worsens sometimes and this is consistent with one of those episodes. She states that she hasn't taken anything for the symptoms. No dysuria, fever, diarrhea or vaginal discharge. She is also c/o bilateral knee pain but she states that she has been working 2 jobs and walking up and down stairs.  The history is provided by the patient. No language interpreter was used.    Past Medical History  Diagnosis Date  . Hyperlipidemia   . Hypertension   . Morbid obesity (HCC)   . Osteoarthritis, chronic     bilateral knees  . Diabetes mellitus    Past Surgical History  Procedure Laterality Date  . Total abdominal hysterectomy    . Tubal ligation    . Total knee arthroplasty    . Cholecystectomy    . Gastric bypass     Family History  Problem Relation Age of Onset  . Hypertension Father   . Anuerysm Mother 2549    mother died in [id number] at age 51 from brain aneurysm  . Other      estranged from father   Social History  Substance Use Topics  . Smoking status: Never Smoker   . Smokeless tobacco: None  . Alcohol Use: No   OB History    No data available     Review of Systems  All other systems reviewed and are negative.     Allergies  Percocet  Home Medications   Prior to Admission medications   Medication Sig Start Date End Date Taking? Authorizing Provider  amLODipine (NORVASC) 5 MG tablet Take 5 mg by mouth daily.   Yes Historical Provider, MD  cloNIDine (CATAPRES) 0.1 MG tablet Take 0.1 mg by mouth 2 (two)  times daily.     Yes Historical Provider, MD  lisinopril (PRINIVIL,ZESTRIL) 20 MG tablet Take 20 mg by mouth daily.   Yes Historical Provider, MD  Multiple Vitamin (MULTIVITAMIN) tablet Take 1 tablet by mouth daily.   Yes Historical Provider, MD  pravastatin (PRAVACHOL) 40 MG tablet Take 40 mg by mouth daily.     Yes Historical Provider, MD  ALPRAZolam Prudy Feeler(XANAX) 0.5 MG tablet 1 tablet by mouth 1/2 hour before MRI 06/11/10   Doe-Hyun R Artist PaisYoo, DO  hydrochlorothiazide (MICROZIDE) 12.5 MG capsule Take 1 capsule (12.5 mg total) by mouth every morning. 12/26/10   Doe-Hyun R Artist PaisYoo, DO  losartan (COZAAR) 100 MG tablet Take 1 tablet (100 mg total) by mouth daily. 08/17/10 08/17/11  Edwyna Perfecthomas W Hodgin, MD  metFORMIN (GLUCOPHAGE-XR) 500 MG 24 hr tablet TAKE ONE (1) TABLET(S) TWICE DAILY 08/31/10   Edwyna Perfecthomas W Hodgin, MD  metoprolol (TOPROL-XL) 50 MG 24 hr tablet Take 1 tablet (50 mg total) by mouth daily. 12/26/10   Doe-Hyun Sherran Needs Yoo, DO  Misc. Devices (HUGO ROLLING WALKER) MISC 1 Device by Does not apply route once. 10/03/10   Lenda KelpShane R Hudnall, MD  ondansetron (ZOFRAN) 8 MG tablet Take 1 tablet (8 mg total) by mouth every 4 (four) hours as needed for nausea. 03/24/14  Hannah Muthersbaugh, PA-C  traMADol (ULTRAM) 50 MG tablet Take 1 tablet (50 mg total) by mouth every 6 (six) hours as needed. 03/24/14   Hannah Muthersbaugh, PA-C   BP 153/92 mmHg  Pulse 66  Temp(Src) 98.4 F (36.9 C) (Oral)  Resp 20  Ht 5' (1.524 m)  Wt 198 lb (89.812 kg)  BMI 38.67 kg/m2  SpO2 100% Physical Exam  Constitutional: She is oriented to person, place, and time. She appears well-developed and well-nourished.  HENT:  Head: Normocephalic and atraumatic.  Cardiovascular: Normal rate and regular rhythm.   Pulmonary/Chest: Effort normal and breath sounds normal.  Abdominal: Soft. Bowel sounds are normal. There is tenderness in the epigastric area.  Musculoskeletal: Normal range of motion.  Neurological: She is alert and oriented to person, place,  and time.  Skin: Skin is warm and dry.  Psychiatric: She has a normal mood and affect.  Nursing note and vitals reviewed.   ED Course  Procedures (including critical care time) Labs Review Labs Reviewed  CBC WITH DIFFERENTIAL/PLATELET - Abnormal; Notable for the following:    RBC 3.64 (*)    Hemoglobin 11.2 (*)    HCT 34.1 (*)    All other components within normal limits  LIPASE, BLOOD - Abnormal; Notable for the following:    Lipase 164 (*)    All other components within normal limits  URINALYSIS, ROUTINE W REFLEX MICROSCOPIC (NOT AT [facility])  COMPREHENSIVE METABOLIC PANEL    Imaging Review No results found. I have personally reviewed and evaluated these images and lab results as part of my medical decision-making.   EKG Interpretation None      MDM   Final diagnoses:  None    Pt left with Dr. Rubin Payor for pending ct scan    Teressa Lower, NP 12/23/14 1610  Benjiman Core, MD 12/23/14 2330

## 2015-06-30 ENCOUNTER — Encounter (HOSPITAL_BASED_OUTPATIENT_CLINIC_OR_DEPARTMENT_OTHER): Payer: Self-pay | Admitting: *Deleted

## 2015-06-30 ENCOUNTER — Emergency Department (HOSPITAL_BASED_OUTPATIENT_CLINIC_OR_DEPARTMENT_OTHER)
Admission: EM | Admit: 2015-06-30 | Discharge: 2015-06-30 | Disposition: A | Discharge status: Home / Self Care | Payer: No Typology Code available for payment source | Attending: Emergency Medicine | Admitting: Emergency Medicine

## 2015-06-30 DIAGNOSIS — M79606 Pain in leg, unspecified: Secondary | ICD-10-CM

## 2015-06-30 DIAGNOSIS — Z7984 Long term (current) use of oral hypoglycemic drugs: Secondary | ICD-10-CM | POA: Insufficient documentation

## 2015-06-30 DIAGNOSIS — G8929 Other chronic pain: Secondary | ICD-10-CM

## 2015-06-30 DIAGNOSIS — E119 Type 2 diabetes mellitus without complications: Secondary | ICD-10-CM | POA: Insufficient documentation

## 2015-06-30 DIAGNOSIS — G2581 Restless legs syndrome: Secondary | ICD-10-CM | POA: Insufficient documentation

## 2015-06-30 DIAGNOSIS — I10 Essential (primary) hypertension: Secondary | ICD-10-CM | POA: Insufficient documentation

## 2015-06-30 DIAGNOSIS — Z79899 Other long term (current) drug therapy: Secondary | ICD-10-CM | POA: Insufficient documentation

## 2015-06-30 DIAGNOSIS — M79604 Pain in right leg: Secondary | ICD-10-CM | POA: Insufficient documentation

## 2015-06-30 DIAGNOSIS — E785 Hyperlipidemia, unspecified: Secondary | ICD-10-CM | POA: Insufficient documentation

## 2015-06-30 DIAGNOSIS — M79605 Pain in left leg: Secondary | ICD-10-CM | POA: Insufficient documentation

## 2015-06-30 DIAGNOSIS — R197 Diarrhea, unspecified: Secondary | ICD-10-CM | POA: Insufficient documentation

## 2015-06-30 LAB — COMPREHENSIVE METABOLIC PANEL
ALT: 16 U/L (ref 14–54)
ANION GAP: 7 (ref 5–15)
AST: 24 U/L (ref 15–41)
Albumin: 4 g/dL (ref 3.5–5.0)
Alkaline Phosphatase: 75 U/L (ref 38–126)
BILIRUBIN TOTAL: 0.7 mg/dL (ref 0.3–1.2)
BUN: 16 mg/dL (ref 6–20)
CO2: 25 mmol/L (ref 22–32)
Calcium: 9.3 mg/dL (ref 8.9–10.3)
Chloride: 107 mmol/L (ref 101–111)
Creatinine, Ser: 1.05 mg/dL — ABNORMAL HIGH (ref 0.44–1.00)
GFR calc Af Amer: >60 mL/min (ref >60)
GFR, EST NON AFRICAN AMERICAN: 60 mL/min — AB (ref >60)
Glucose, Bld: 90 mg/dL (ref 65–99)
Potassium: 4 mmol/L (ref 3.5–5.1)
Sodium: 139 mmol/L (ref 135–145)
TOTAL PROTEIN: 7.4 g/dL (ref 6.5–8.1)

## 2015-06-30 LAB — URINALYSIS, ROUTINE W REFLEX MICROSCOPIC
Bilirubin Urine: NEGATIVE
GLUCOSE, UA: NEGATIVE mg/dL
Hgb urine dipstick: NEGATIVE
KETONES UR: 15 mg/dL — AB
Leukocytes, UA: NEGATIVE
Nitrite: NEGATIVE
PH: 5 (ref 5.0–8.0)
Protein, ur: 30 mg/dL — AB
SPECIFIC GRAVITY, URINE: 1.045 — AB (ref 1.005–1.030)

## 2015-06-30 LAB — CBC
HEMATOCRIT: 33.5 % — AB (ref 36.0–46.0)
HEMOGLOBIN: 11.3 g/dL — AB (ref 12.0–15.0)
MCH: 31 pg (ref 26.0–34.0)
MCHC: 33.7 g/dL (ref 30.0–36.0)
MCV: 92 fL (ref 78.0–100.0)
Platelets: 361 10*3/uL (ref 150–400)
RBC: 3.64 MIL/uL — ABNORMAL LOW (ref 3.87–5.11)
RDW: 13.1 % (ref 11.5–15.5)
WBC: 4.4 10*3/uL (ref 4.0–10.5)

## 2015-06-30 LAB — URINE MICROSCOPIC-ADD ON
RBC / HPF: NONE SEEN RBC/hpf (ref 0–5)
WBC, UA: NONE SEEN WBC/hpf (ref 0–5)

## 2015-06-30 LAB — LIPASE, BLOOD: Lipase: 39 U/L (ref 11–51)

## 2015-06-30 MED ORDER — TRAMADOL HCL 50 MG PO TABS: 50.0000 mg | ORAL_TABLET | Freq: Four times a day (QID) | ORAL | Status: DC | PRN

## 2015-06-30 MED ORDER — ROPINIROLE HCL 0.25 MG PO TABS: ORAL_TABLET | ORAL | Status: DC

## 2015-06-30 NOTE — ED Notes (Signed)
Abdominal pain x 2 days. Diarrhea and leg pain.

## 2015-06-30 NOTE — Discharge Instructions (Signed)
Abdominal (belly) pain can be caused by many things. Your caregiver performed an examination and possibly ordered blood/urine tests and imaging (CT scan, x-rays, ultrasound). Many cases can be observed and treated at home after initial evaluation in the emergency department. Even though you are being discharged home, abdominal pain can be unpredictable. Therefore, you need a repeated exam if your pain does not resolve, returns, or worsens. Most patients with abdominal pain don't have to be admitted to the hospital or have surgery, but serious problems like appendicitis and gallbladder attacks can start out as nonspecific pain. Many abdominal conditions cannot be diagnosed in one visit, so follow-up evaluations are very important. SEEK IMMEDIATE MEDICAL ATTENTION IF: The pain does not go away or becomes severe.  A temperature above 101 develops.  Repeated vomiting occurs (multiple episodes).  The pain becomes localized to portions of the abdomen. The right side could possibly be appendicitis. In an adult, the left lower portion of the abdomen could be colitis or diverticulitis.  Blood is being passed in stools or vomit (bright red or black tarry stools).  Return also if you develop chest pain, difficulty breathing, dizziness or fainting, or become confused, poorly responsive, or inconsolable (young children).  Diarrhea Diarrhea is frequent loose and watery bowel movements. It can cause you to feel weak and dehydrated. Dehydration can cause you to become tired and thirsty, have a dry mouth, and have decreased urination that often is dark yellow. Diarrhea is a sign of another problem, most often an infection that will not last long. In most cases, diarrhea typically lasts 2-3 days. However, it can last longer if it is a sign of something more serious. It is important to treat your diarrhea as directed by your caregiver to lessen or prevent future episodes of diarrhea. CAUSES  Some common causes  include:  Gastrointestinal infections caused by viruses, bacteria, or parasites.  Food poisoning or food allergies.  Certain medicines, such as antibiotics, chemotherapy, and laxatives.  Artificial sweeteners and fructose.  Digestive disorders. HOME CARE INSTRUCTIONS  Ensure adequate fluid intake (hydration): Have 1 cup (8 oz) of fluid for each diarrhea episode. Avoid fluids that contain simple sugars or sports drinks, fruit juices, whole milk products, and sodas. Your urine should be clear or pale yellow if you are drinking enough fluids. Hydrate with an oral rehydration solution that you can purchase at pharmacies, retail stores, and online. You can prepare an oral rehydration solution at home by mixing the following ingredients together:   - tsp table salt.   tsp baking soda.   tsp salt substitute containing potassium chloride.  1  tablespoons sugar.  1 L (34 oz) of water.  Certain foods and beverages may increase the speed at which food moves through the gastrointestinal (GI) tract. These foods and beverages should be avoided and include:  Caffeinated and alcoholic beverages.  High-fiber foods, such as raw fruits and vegetables, nuts, seeds, and whole grain breads and cereals.  Foods and beverages sweetened with sugar alcohols, such as xylitol, sorbitol, and mannitol.  Some foods may be well tolerated and may help thicken stool including:  Starchy foods, such as rice, toast, pasta, low-sugar cereal, oatmeal, grits, baked potatoes, crackers, and bagels.  Bananas.  Applesauce.  Add probiotic-rich foods to help increase healthy bacteria in the GI tract, such as yogurt and fermented milk products.  Wash your hands well after each diarrhea episode.  Only take over-the-counter or prescription medicines as directed by your caregiver.  Take a warm  bath to relieve any burning or pain from frequent diarrhea episodes. SEEK IMMEDIATE MEDICAL CARE IF:   You are unable to  keep fluids down.  You have persistent vomiting.  You have blood in your stool, or your stools are black and tarry.  You do not urinate in 6-8 hours, or there is only a small amount of very dark urine.  You have abdominal pain that increases or localizes.  You have weakness, dizziness, confusion, or light-headedness.  You have a severe headache.  Your diarrhea gets worse or does not get better.  You have a fever or persistent symptoms for more than 2-3 days.  You have a fever and your symptoms suddenly get worse. MAKE SURE YOU:   Understand these instructions.  Will watch your condition.  Will get help right away if you are not doing well or get worse.   This information is not intended to replace advice given to you by your health care provider. Make sure you discuss any questions you have with your health care provider.   Document Released: 01/19/2002 Document Revised: 02/19/2014 Document Reviewed: 10/07/2011 Elsevier Interactive Patient Education 2016 [employer]  Restless Legs Syndrome Restless legs syndrome is a condition that causes uncomfortable feelings or sensations in the legs, especially while sitting or lying down. The sensations usually cause an overwhelming urge to move the legs. The arms can also sometimes be affected. The condition can range from mild to severe. The symptoms often interfere with a person's ability to sleep. CAUSES The cause of this condition is not known. RISK FACTORS This condition is more likely to develop in:  People who are older than age 32.  Pregnant women. In general, restless legs syndrome is more common in women than in men.  People who have a family history of the condition.  People who have certain medical conditions, such as iron deficiency, kidney disease, Parkinson disease, or nerve damage.  People who take certain medicines, such as medicines for high blood pressure, nausea, colds, allergies, depression, and some heart  conditions. SYMPTOMS The main symptom of this condition is uncomfortable sensations in the legs. These sensations may be:  Described as pulling, tingling, prickling, throbbing, crawling, or burning.  Worse while you are sitting or lying down.  Worse during periods of rest or inactivity.  Worse at night, often interfering with your sleep.  Accompanied by a very strong urge to move your legs.  Temporarily relieved by movement of your legs. The sensations usually affect both sides of the body. The arms can also be affected, but this is rare. People who have this condition often have tiredness during the day because of their lack of sleep at night. DIAGNOSIS This condition may be diagnosed based on your description of the symptoms. You may also have tests, including blood tests, to check for other conditions that may lead to your symptoms. In some cases, you may be asked to spend some time in a sleep lab so your sleeping can be monitored. TREATMENT Treatment for this condition is focused on managing the symptoms. Treatment may include:  Self-help and lifestyle changes.  Medicines. HOME CARE INSTRUCTIONS  Take medicines only as directed by your health care provider.  Try these methods to get temporary relief from the uncomfortable sensations:  Massage your legs.  Walk or stretch.  Take a cold or hot bath.  Practice good sleep habits. For example, go to bed and get up at the same time every day.  Exercise regularly.  Practice ways of relaxing, such as yoga or meditation.  Avoid caffeine and alcohol.  Do not use any tobacco products, including cigarettes, chewing tobacco, or electronic cigarettes. If you need help quitting, ask your health care provider.  Keep all follow-up visits as directed by your health care provider. This is important. SEEK MEDICAL CARE IF: Your symptoms do not improve with treatment, or they get worse.   This information is not intended to replace  advice given to you by your health care provider. Make sure you discuss any questions you have with your health care provider.   Document Released: 01/19/2002 Document Revised: 06/15/2014 Document Reviewed: 01/25/2014 Elsevier Interactive Patient Education [employer]2016 [employer] Inc[employer]

## 2015-06-30 NOTE — ED Provider Notes (Signed)
CSN: [id number]     Arrival date & time 06/30/15  2052 History   First MD Initiated Contact with Patient 06/30/15 2155     Chief Complaint  Patient presents with  . Abdominal Pain     (Consider location/radiation/quality/duration/timing/severity/associated sxs/prior Treatment) HPI  Faith [name] is a(n) 50 y.o. female who presents  Chief complaint of bilateral leg pain. Patient also complains of some mild abdominal cramping, cramping and watery diarrhea over the past 2 days, but states that her chief complaint is her leg pain. She is a history of chronic leg pain, history of bilateral knee replacements, history of morbid obesity and is status post gastric bypass. Patient states that she has had achy legs, prolonged time, however, with the onset of her abdominal cramping and watery stools. She's had increased pain in her book with her legs. She had difficulty ambulating and completing her ADLs. 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 She also complains of restless legs. She denies any nausea, vomiting. She's been taking Tylenol, but is unable to take NSAIDs due to her gastric bypass. Last episode of diarrhea was yesterday. She states that her abdominal pain is mild and crampy. She denies any urinary symptoms.  Past Medical History  Diagnosis Date  . Hyperlipidemia   . Hypertension   . Morbid obesity (HCC)   . Osteoarthritis, chronic     bilateral knees  . Diabetes mellitus    Past Surgical History  Procedure Laterality Date  . Total abdominal hysterectomy    . Tubal ligation    . Total knee arthroplasty    . Cholecystectomy    . Gastric bypass     Family History  Problem Relation Age of Onset  . Hypertension Father   . Anuerysm Mother 66    mother died in 51 at age 18 from brain aneurysm  . Other      estranged from father   Social History  Substance Use Topics  . Smoking status: Never Smoker    . Smokeless tobacco: None  . Alcohol Use: No   OB History    No data available     Review of Systems  Ten systems reviewed and are negative for acute change, except as noted in the HPI.    Allergies  Percocet  Home Medications   Prior to Admission medications   Medication Sig Start Date End Date Taking? Authorizing Provider  ALPRAZolam Prudy Feeler) 0.5 MG tablet 1 tablet by mouth 1/2 hour before MRI 06/11/10   Doe-Hyun R Artist Pais, DO  amLODipine (NORVASC) 5 MG tablet Take 5 mg by mouth daily.    Historical Provider, MD  cloNIDine (CATAPRES) 0.1 MG tablet Take 0.1 mg by mouth 2 (two) times daily.      Historical Provider, MD  hydrochlorothiazide (MICROZIDE) 12.5 MG capsule Take 1 capsule (12.5 mg total) by mouth every morning. 12/26/10   Doe-Hyun R Artist Pais, DO  lisinopril (PRINIVIL,ZESTRIL) 20 MG tablet Take 20 mg by mouth daily.    Historical Provider, MD  losartan (COZAAR) 100 MG tablet Take 1 tablet (100 mg total) by mouth daily. 08/17/10 08/17/11  Edwyna Perfect, MD  metFORMIN (GLUCOPHAGE-XR) 500 MG 24 hr tablet TAKE ONE (1) TABLET(S) TWICE DAILY 08/31/10   Edwyna Perfect, MD  metoprolol (TOPROL-XL) 50 MG 24 hr tablet Take 1 tablet (50 mg total) by mouth daily. 12/26/10   Doe-Hyun Sherran Needs, DO  Misc. Devices (HUGO ROLLING WALKER) MISC 1 Device by Does not apply route once. 10/03/10   Lenda Kelp, MD  Multiple Vitamin (MULTIVITAMIN) tablet Take 1 tablet by mouth daily.    Historical Provider, MD  ondansetron (ZOFRAN) 8 MG tablet Take 1 tablet (8 mg total) by mouth every 4 (four) hours as needed for nausea. 03/24/14   Hannah Muthersbaugh, PA-C  pravastatin (PRAVACHOL) 40 MG tablet Take 40 mg by mouth daily.      Historical Provider, MD  promethazine (PHENERGAN) 25 MG tablet Take 1 tablet (25 mg total) by mouth every 6 (six) hours as needed for nausea or vomiting. 12/23/14   Benjiman Core, MD  rOPINIRole (REQUIP) 0.25 MG tablet Take 1 tablet 1-3 hours before bedtime 06/30/15   Arthor Captain, PA-C   traMADol (ULTRAM) 50 MG tablet Take 1 tablet (50 mg total) by mouth every 6 (six) hours as needed. 06/30/15   Torrence Branagan, PA-C   BP 165/82 mmHg  Pulse 62  Temp(Src) 98.5 F (36.9 C) (Oral)  Resp 18  Ht 5' (1.524 m)  Wt 86.183 kg  BMI 37.11 kg/m2  SpO2 100% Physical Exam Physical Exam  Nursing note and vitals reviewed. Constitutional: She is oriented to person, place, and time. She appears well-developed and well-nourished. No distress.  HENT:  Head: Normocephalic and atraumatic.  Eyes: Conjunctivae normal and EOM are normal. Pupils are equal, round, and reactive to light. No scleral icterus.  Neck: Normal range of motion.  Cardiovascular: Normal rate, regular rhythm and normal heart sounds.  Exam reveals no gallop and no friction rub.   No murmur heard. Pulmonary/Chest: Effort normal and breath sounds normal. No respiratory distress.  Abdominal: Soft. Bowel sounds are normal. She exhibits no distension and no mass. There is no tenderness. There is no guarding.  Neurological: She is alert and oriented to person, place, and time.  Skin: Skin is warm and dry. She is not diaphoretic.   ED Course  Procedures (including critical care time) Labs Review Labs Reviewed  COMPREHENSIVE METABOLIC PANEL - Abnormal; Notable for the following:    Creatinine, Ser 1.05 (*)    GFR calc non Af Amer 60 (*)    All other components within normal limits  CBC - Abnormal; Notable for the following:    RBC 3.64 (*)    Hemoglobin 11.3 (*)    HCT 33.5 (*)    All other components within normal limits  URINALYSIS, ROUTINE W REFLEX MICROSCOPIC (NOT AT [facility]) - Abnormal; Notable for the following:    Specific Gravity, Urine 1.045 (*)    Ketones, ur 15 (*)    Protein, ur 30 (*)    All other components within normal limits  URINE MICROSCOPIC-ADD ON - Abnormal; Notable for the following:    Squamous Epithelial / LPF 0-5 (*)    Bacteria, UA RARE (*)    All other components within normal limits   LIPASE, BLOOD    Imaging Review No results found. I have personally reviewed and evaluated these images and lab results as part of my medical decision-making.   EKG Interpretation None      MDM   Final diagnoses:  Diarrhea, unspecified type  Restless legs  Ongoing leg pain, unspecified laterality    Patient with leg pain. Suspect myalgias associated with viral infections. Labs appear at baseline.  +hypertension. I have given the patient a referral to community health and wellness. + tramadol and requip at discharge.      Arthor Captain,  PA-C 06/30/15 2321  Lyndal Pulleyaniel Knott, MD 07/01/15 0157

## 2018-12-14 ENCOUNTER — Emergency Department (HOSPITAL_BASED_OUTPATIENT_CLINIC_OR_DEPARTMENT_OTHER)
Admission: EM | Admit: 2018-12-14 | Discharge: 2018-12-14 | Disposition: A | Discharge status: Home / Self Care | Payer: No Typology Code available for payment source | Attending: Emergency Medicine | Admitting: Emergency Medicine

## 2018-12-14 ENCOUNTER — Other Ambulatory Visit: Payer: Self-pay

## 2018-12-14 ENCOUNTER — Encounter (HOSPITAL_BASED_OUTPATIENT_CLINIC_OR_DEPARTMENT_OTHER): Payer: Self-pay | Admitting: Emergency Medicine

## 2018-12-14 DIAGNOSIS — R519 Headache, unspecified: Secondary | ICD-10-CM | POA: Insufficient documentation

## 2018-12-14 DIAGNOSIS — I10 Essential (primary) hypertension: Secondary | ICD-10-CM | POA: Insufficient documentation

## 2018-12-14 DIAGNOSIS — Z79899 Other long term (current) drug therapy: Secondary | ICD-10-CM | POA: Insufficient documentation

## 2018-12-14 DIAGNOSIS — E119 Type 2 diabetes mellitus without complications: Secondary | ICD-10-CM | POA: Insufficient documentation

## 2018-12-14 DIAGNOSIS — R1030 Lower abdominal pain, unspecified: Secondary | ICD-10-CM | POA: Insufficient documentation

## 2018-12-14 DIAGNOSIS — Z7984 Long term (current) use of oral hypoglycemic drugs: Secondary | ICD-10-CM | POA: Insufficient documentation

## 2018-12-14 LAB — BASIC METABOLIC PANEL
Anion gap: 10 (ref 5–15)
BUN: 14 mg/dL (ref 6–20)
CO2: 23 mmol/L (ref 22–32)
Calcium: 9.8 mg/dL (ref 8.9–10.3)
Chloride: 106 mmol/L (ref 98–111)
Creatinine, Ser: 1.01 mg/dL — ABNORMAL HIGH (ref 0.44–1.00)
GFR calc Af Amer: >60 mL/min (ref >60)
GFR calc non Af Amer: >60 mL/min (ref >60)
Glucose, Bld: 100 mg/dL — ABNORMAL HIGH (ref 70–99)
Potassium: 3.9 mmol/L (ref 3.5–5.1)
Sodium: 139 mmol/L (ref 135–145)

## 2018-12-14 LAB — URINALYSIS, ROUTINE W REFLEX MICROSCOPIC
Glucose, UA: NEGATIVE mg/dL
Hgb urine dipstick: NEGATIVE
Ketones, ur: 15 mg/dL — AB
Leukocytes,Ua: NEGATIVE
Nitrite: NEGATIVE
Protein, ur: 100 mg/dL — AB
Specific Gravity, Urine: >1.03 — ABNORMAL HIGH (ref 1.005–1.030)
pH: 5.5 (ref 5.0–8.0)

## 2018-12-14 LAB — CBC WITH DIFFERENTIAL/PLATELET
Abs Immature Granulocytes: 0.01 10*3/uL (ref 0.00–0.07)
Basophils Absolute: 0 10*3/uL (ref 0.0–0.1)
Basophils Relative: 0 %
Eosinophils Absolute: 0 10*3/uL (ref 0.0–0.5)
Eosinophils Relative: 0 %
HCT: 42.4 % (ref 36.0–46.0)
Hemoglobin: 13.8 g/dL (ref 12.0–15.0)
Immature Granulocytes: 0 %
Lymphocytes Relative: 34 %
Lymphs Abs: 1.9 10*3/uL (ref 0.7–4.0)
MCH: 30.2 pg (ref 26.0–34.0)
MCHC: 32.5 g/dL (ref 30.0–36.0)
MCV: 92.8 fL (ref 80.0–100.0)
Monocytes Absolute: 0.2 10*3/uL (ref 0.1–1.0)
Monocytes Relative: 4 %
Neutro Abs: 3.5 10*3/uL (ref 1.7–7.7)
Neutrophils Relative %: 62 %
Platelets: 383 10*3/uL (ref 150–400)
RBC: 4.57 MIL/uL (ref 3.87–5.11)
RDW: 13.1 % (ref 11.5–15.5)
WBC: 5.7 10*3/uL (ref 4.0–10.5)
nRBC: 0 % (ref 0.0–0.2)

## 2018-12-14 LAB — URINALYSIS, MICROSCOPIC (REFLEX)
RBC / HPF: NONE SEEN RBC/hpf (ref 0–5)
WBC, UA: NONE SEEN WBC/hpf (ref 0–5)

## 2018-12-14 LAB — PREGNANCY, URINE: Preg Test, Ur: NEGATIVE

## 2018-12-14 MED ORDER — KETOROLAC TROMETHAMINE 15 MG/ML IJ SOLN
15.0000 mg | Freq: Once | INTRAMUSCULAR | Status: AC
  Administered 2018-12-14: 19:00:00 15 mg via INTRAVENOUS
  Filled 2018-12-14: qty 1

## 2018-12-14 MED ORDER — IBUPROFEN 400 MG PO TABS: 400.0000 mg | ORAL_TABLET | Freq: Four times a day (QID) | ORAL | 0 refills | Status: DC | PRN

## 2018-12-14 MED ORDER — METOCLOPRAMIDE HCL 5 MG/ML IJ SOLN
10.0000 mg | Freq: Once | INTRAMUSCULAR | Status: AC
  Administered 2018-12-14: 10 mg via INTRAVENOUS
  Filled 2018-12-14: qty 2

## 2018-12-14 NOTE — Discharge Instructions (Signed)
You are seen in the ER for the headaches.  We are glad that your headaches are better.  Given that there is family history of brain aneurysm, if your headaches return, consider seeing the neurologist.  Return to the ER immediately if you start having any neurologic symptoms such as weakness, numbness, vision change, slurred speech.  Additionally, you also had lower abdominal pain.  Urine does not show any signs of infection.  You have declined pelvic exam for now.  If your symptoms get worse or you start developing fevers, see your primary care doctor.

## 2018-12-14 NOTE — ED Triage Notes (Signed)
Pt c/o HA x 2 days. Pt also reports nausea with freq urination. Pt hypertensive in triage. Ambulatory in triage Pt reports 1 g tylenol 1300 pta with no relief

## 2018-12-14 NOTE — ED Provider Notes (Signed)
[facility] EMERGENCY DEPARTMENT Provider Note   CSN: [id number] Arrival date & time: 12/14/18  1611     History   Chief Complaint Chief Complaint  Patient presents with  . Headache    HPI Faith [name] is a 53 y.o. female.     HPI  53 year old female comes in a chief complaint of headaches.  Patient also complains of lower abdominal discomfort.  Headaches are primary complaint.  She is having frontal headaches that are moving down towards her eyes.  She has no photophobia or vision changes.  She reports that she has family history of brain aneurysm, her mother had complications from it at age 55.  Patient does not have history of headaches unless her BP is high and typically her headaches are similar to the one she is having right now.  She denies any new numbness, tingling, vision change, slurred speech, neck pain.  Headache at its worst is 10 out of 10.  The headache is been constant and there is no specific evoking, aggravating or relieving factors.  She has taken extra strength Tylenol without any significant pain relief.  Additionally she has some lower quadrant abdominal pain.  She denies any vaginal discharge, no high risk sexual behavior and she has no UTI-like symptoms or vaginal bleeding.  She has mild discharge.  Past Medical History:  Diagnosis Date  . Diabetes mellitus   . Hyperlipidemia   . Hypertension   . Morbid obesity (HCC)   . Osteoarthritis, chronic    bilateral knees    Patient Active Problem List   Diagnosis Date Noted  . Left hip pain 10/05/2010  . UTI (lower urinary tract infection) 09/11/2010  . Bronchitis 09/11/2010  . Neck pain 05/26/2010  . HTN (hypertension) 05/26/2010  . ACUTE PHARYNGITIS 04/21/2010  . PARESTHESIA 09/28/2009  . NIPPLE DISCHARGE 08/10/2009  . DYSPAREUNIA 08/10/2009  . SKIN LESION 08/10/2009  . PELVIC  PAIN 08/10/2009  . CARBUNCLE, BUTTOCK 05/30/2009  . KNEE PAIN 05/30/2009  . HYPERLIPIDEMIA 03/28/2009  .  HYPERTENSION 03/28/2009  . GERD 03/28/2009  . SHOULDER PAIN, RIGHT 03/28/2009  . DIABETES MELLITUS, TYPE II, BORDERLINE 03/28/2009    Past Surgical History:  Procedure Laterality Date  . CHOLECYSTECTOMY    . GASTRIC BYPASS    . TOTAL ABDOMINAL HYSTERECTOMY    . TOTAL KNEE ARTHROPLASTY    . TUBAL LIGATION       OB History   No obstetric history on file.      Home Medications    Prior to Admission medications   Medication Sig Start Date End Date Taking? Authorizing Provider  amLODipine (NORVASC) 5 MG tablet Take 10 mg by mouth daily.    Yes [provider]  lisinopril (PRINIVIL,ZESTRIL) 20 MG tablet Take 20 mg by mouth daily.   Yes [provider]  Multiple Vitamin (MULTIVITAMIN) tablet Take 1 tablet by mouth daily.   Yes [provider]  ALPRAZolam Prudy Feeler(XANAX) 0.5 MG tablet 1 tablet by mouth 1/2 hour before MRI 06/11/10   Artist PaisYoo, Doe-Hyun R, DO  cloNIDine (CATAPRES) 0.1 MG tablet Take 0.1 mg by mouth 2 (two) times daily.      [provider]  hydrochlorothiazide (MICROZIDE) 12.5 MG capsule Take 1 capsule (12.5 mg total) by mouth every morning. 12/26/10   Artist PaisYoo, Doe-Hyun R, DO  ibuprofen (ADVIL) 400 MG tablet Take 1 tablet (400 mg total) by mouth every 6 (six) hours as needed. 12/14/18   Derwood KaplanNanavati, Khloey Chern, MD  losartan (COZAAR) 100  MG tablet Take 1 tablet (100 mg total) by mouth daily. 08/17/10 08/17/11  Burnice Logan, MD  metFORMIN (GLUCOPHAGE-XR) 500 MG 24 hr tablet TAKE ONE (1) TABLET(S) TWICE DAILY 08/31/10   Burnice Logan, MD  metoprolol (TOPROL-XL) 50 MG 24 hr tablet Take 1 tablet (50 mg total) by mouth daily. 12/26/10   Shawna Orleans, Doe-Hyun R, DO  Misc. Devices (HUGO ROLLING WALKER) MISC 1 Device by Does not apply route once. 10/03/10   Hudnall, Sharyn Lull, MD  ondansetron (ZOFRAN) 8 MG tablet Take 1 tablet (8 mg total) by mouth every 4 (four) hours as needed for nausea. 03/24/14   Muthersbaugh, Jarrett Soho, PA-C  pravastatin (PRAVACHOL) 40 MG tablet Take 40 mg by  mouth daily.      [provider]  promethazine (PHENERGAN) 25 MG tablet Take 1 tablet (25 mg total) by mouth every 6 (six) hours as needed for nausea or vomiting. 12/23/14   Davonna Belling, MD  rOPINIRole (REQUIP) 0.25 MG tablet Take 1 tablet 1-3 hours before bedtime 06/30/15   Harris, Vernie Shanks, PA-C  traMADol (ULTRAM) 50 MG tablet Take 1 tablet (50 mg total) by mouth every 6 (six) hours as needed. 06/30/15   Margarita Mail, PA-C    Family History Family History  Problem Relation Age of Onset  . Hypertension Father   . Anuerysm Mother 45       mother died in 49 at age 8 from brain aneurysm  . Other Other        estranged from father    Social History Social History   Tobacco Use  . Smoking status: Never Smoker  Substance Use Topics  . Alcohol use: No  . Drug use: No     Allergies   Percocet [oxycodone-acetaminophen]   Review of Systems Review of Systems  Constitutional: Positive for activity change.  Gastrointestinal: Positive for abdominal pain.  Genitourinary: Negative for dysuria.  Neurological: Positive for headaches.  All other systems reviewed and are negative.    Physical Exam Updated Vital Signs BP (!) 153/88 (BP Location: Left Arm)   Pulse 76   Temp 98.4 F (36.9 C) (Oral)   Resp 18   Ht 5' (1.524 m)   Wt 96 kg   SpO2 100%   BMI 41.33 kg/m   Physical Exam Vitals signs and nursing note reviewed.  Constitutional:      Appearance: She is well-developed.  HENT:     Head: Normocephalic and atraumatic.  Neck:     Musculoskeletal: Normal range of motion and neck supple.  Cardiovascular:     Rate and Rhythm: Normal rate.  Pulmonary:     Effort: Pulmonary effort is normal.  Abdominal:     General: Bowel sounds are normal.  Skin:    General: Skin is warm and dry.  Neurological:     Mental Status: She is alert and oriented to person, place, and time.     GCS: GCS eye subscore is 4. GCS verbal subscore is 5. GCS motor subscore is 6.       ED Treatments / Results  Labs (all labs ordered are listed, but only abnormal results are displayed) Labs Reviewed  BASIC METABOLIC PANEL - Abnormal; Notable for the following components:      Result Value   Glucose, Bld 100 (*)    Creatinine, Ser 1.01 (*)    All other components within normal limits  URINALYSIS, ROUTINE W REFLEX MICROSCOPIC - Abnormal; Notable for the following components:   Specific Gravity,  Urine >1.030 (*)    Bilirubin Urine SMALL (*)    Ketones, ur 15 (*)    Protein, ur 100 (*)    All other components within normal limits  URINALYSIS, MICROSCOPIC (REFLEX) - Abnormal; Notable for the following components:   Bacteria, UA RARE (*)    All other components within normal limits  URINE CULTURE  CBC WITH DIFFERENTIAL/PLATELET  PREGNANCY, URINE    EKG None  Radiology No results found.  Procedures Procedures (including critical care time)  Medications Ordered in ED Medications  ketorolac (TORADOL) 15 MG/ML injection 15 mg (15 mg Intravenous Given 12/14/18 1836)  metoCLOPramide (REGLAN) injection 10 mg (10 mg Intravenous Given 12/14/18 1839)     Initial Impression / Assessment and Plan / ED Course  I have reviewed the triage vital signs and the nursing notes.  Pertinent labs & imaging results that were available during my care of the patient were reviewed by me and considered in my medical decision making (see chart for details).        53 year old comes in a chief complaint of headache and abdominal pain.  Her primary complaint is headache.  Headache has been constant for 2 days.  No red flags on history suggesting elevated ICP but there is family history of intracranial aneurysm.  Differential diagnosis does include cerebral aneurysm because of it.  We do not think she has active bleed or subarachnoid hemorrhage right now -as the headache was not thunderclap in nature and she has not had any neurologic deficits despite the pain going on for 2  days.  Toradol and Reglan ordered for now.  Will reassess to see if she needs a CT angiogram.  Additionally, she is having some lower quadrant abdominal discomfort.  She has no UTI-like symptoms and she does not think she has STDs.  We will check her UA and reassess her.   Reassessment: Results of the ED work-up discussed with the patient.  Her UA is clean.  Patient does not think she has UTI and also does not think she has STDs.  She has declined pelvic exam.  Additionally, she informs to me that her headache is completely resolved and that she is ready to go home.  I informed her that given that her mother had brain aneurysm, and she typically does not get headaches, we did consider the possibility that she also could have aneurysm.  She thinks that her headaches were high because of her BP and she would much rather prefer following with the PCP.  We discussed retear to precautions pertaining to worsening aneurysm or rupture.  She is comfortable with the plan of follow-up with her PCP.  Final Clinical Impressions(s) / ED Diagnoses   Final diagnoses:  Acute nonintractable headache, unspecified headache type  Lower abdominal pain    ED Discharge Orders         Ordered    ibuprofen (ADVIL) 400 MG tablet  Every 6 hours PRN     12/14/18 2053           Derwood Kaplan, MD 12/15/18 0037

## 2018-12-15 LAB — URINE CULTURE: Culture: NO GROWTH

## 2020-11-23 ENCOUNTER — Other Ambulatory Visit: Payer: Self-pay

## 2020-11-23 ENCOUNTER — Encounter (HOSPITAL_BASED_OUTPATIENT_CLINIC_OR_DEPARTMENT_OTHER): Payer: Self-pay

## 2020-11-23 ENCOUNTER — Emergency Department (HOSPITAL_BASED_OUTPATIENT_CLINIC_OR_DEPARTMENT_OTHER): Payer: BC Managed Care – PPO

## 2020-11-23 ENCOUNTER — Observation Stay (HOSPITAL_BASED_OUTPATIENT_CLINIC_OR_DEPARTMENT_OTHER)
Admission: EM | Admit: 2020-11-23 | Discharge: 2020-11-25 | Disposition: A | Discharge status: Home / Self Care | Payer: BC Managed Care – PPO | Attending: Internal Medicine | Admitting: Internal Medicine

## 2020-11-23 DIAGNOSIS — E278 Other specified disorders of adrenal gland: Secondary | ICD-10-CM | POA: Diagnosis present

## 2020-11-23 DIAGNOSIS — I200 Unstable angina: Principal | ICD-10-CM | POA: Insufficient documentation

## 2020-11-23 DIAGNOSIS — R479 Unspecified speech disturbances: Secondary | ICD-10-CM

## 2020-11-23 DIAGNOSIS — I10 Essential (primary) hypertension: Secondary | ICD-10-CM | POA: Diagnosis present

## 2020-11-23 DIAGNOSIS — Z7984 Long term (current) use of oral hypoglycemic drugs: Secondary | ICD-10-CM | POA: Insufficient documentation

## 2020-11-23 DIAGNOSIS — Z96659 Presence of unspecified artificial knee joint: Secondary | ICD-10-CM | POA: Insufficient documentation

## 2020-11-23 DIAGNOSIS — R079 Chest pain, unspecified: Secondary | ICD-10-CM | POA: Diagnosis not present

## 2020-11-23 DIAGNOSIS — Z20822 Contact with and (suspected) exposure to covid-19: Secondary | ICD-10-CM | POA: Diagnosis not present

## 2020-11-23 DIAGNOSIS — E1169 Type 2 diabetes mellitus with other specified complication: Secondary | ICD-10-CM | POA: Diagnosis present

## 2020-11-23 DIAGNOSIS — K449 Diaphragmatic hernia without obstruction or gangrene: Secondary | ICD-10-CM | POA: Diagnosis not present

## 2020-11-23 DIAGNOSIS — E119 Type 2 diabetes mellitus without complications: Secondary | ICD-10-CM | POA: Insufficient documentation

## 2020-11-23 DIAGNOSIS — R4781 Slurred speech: Secondary | ICD-10-CM | POA: Insufficient documentation

## 2020-11-23 DIAGNOSIS — Z79899 Other long term (current) drug therapy: Secondary | ICD-10-CM | POA: Diagnosis not present

## 2020-11-23 DIAGNOSIS — E785 Hyperlipidemia, unspecified: Secondary | ICD-10-CM | POA: Diagnosis present

## 2020-11-23 DIAGNOSIS — I152 Hypertension secondary to endocrine disorders: Secondary | ICD-10-CM | POA: Diagnosis present

## 2020-11-23 LAB — BASIC METABOLIC PANEL
Anion gap: 7 (ref 5–15)
BUN: 18 mg/dL (ref 6–20)
CO2: 26 mmol/L (ref 22–32)
Calcium: 9.2 mg/dL (ref 8.9–10.3)
Chloride: 105 mmol/L (ref 98–111)
Creatinine, Ser: 1.08 mg/dL — ABNORMAL HIGH (ref 0.44–1.00)
GFR, Estimated: 60 mL/min — ABNORMAL LOW (ref >60)
Glucose, Bld: 86 mg/dL (ref 70–99)
Potassium: 4.5 mmol/L (ref 3.5–5.1)
Sodium: 138 mmol/L (ref 135–145)

## 2020-11-23 LAB — CBC
HCT: 34.3 % — ABNORMAL LOW (ref 36.0–46.0)
Hemoglobin: 11.4 g/dL — ABNORMAL LOW (ref 12.0–15.0)
MCH: 30.2 pg (ref 26.0–34.0)
MCHC: 33.2 g/dL (ref 30.0–36.0)
MCV: 91 fL (ref 80.0–100.0)
Platelets: 335 10*3/uL (ref 150–400)
RBC: 3.77 MIL/uL — ABNORMAL LOW (ref 3.87–5.11)
RDW: 13 % (ref 11.5–15.5)
WBC: 5.4 10*3/uL (ref 4.0–10.5)
nRBC: 0 % (ref 0.0–0.2)

## 2020-11-23 LAB — GLUCOSE, CAPILLARY: Glucose-Capillary: 99 mg/dL (ref 70–99)

## 2020-11-23 LAB — RESP PANEL BY RT-PCR (FLU A&B, COVID) ARPGX2
Influenza A by PCR: NEGATIVE
Influenza B by PCR: NEGATIVE
SARS Coronavirus 2 by RT PCR: NEGATIVE

## 2020-11-23 LAB — TROPONIN I (HIGH SENSITIVITY)
Troponin I (High Sensitivity): 4 ng/L (ref <18)
Troponin I (High Sensitivity): 4 ng/L (ref <18)

## 2020-11-23 IMAGING — CT CT ANGIO CHEST
3 of 10 series · 17 of 46 positions shown · IV contrast (omnipaque)
Comparison: None.

CLINICAL DATA: 55-year-old female with history of chest pain
intermittently since yesterday.

EXAM:
CT ANGIOGRAPHY CHEST WITH CONTRAST
TECHNIQUE: Multidetector CT imaging of the chest was performed using the
standard protocol during bolus administration of intravenous
contrast. Multiplanar CT image reconstructions and MIPs were
obtained to evaluate the vascular anatomy.
CONTRAST:  100mL OMNIPAQUE IOHEXOL 350 MG/ML SOLN

[Series 7: axial arterial · axial · arterial · 0.74mm/px · z∈[+866,+1098]mm · 12 of 93 slices shown]
[im 8/93  lung]
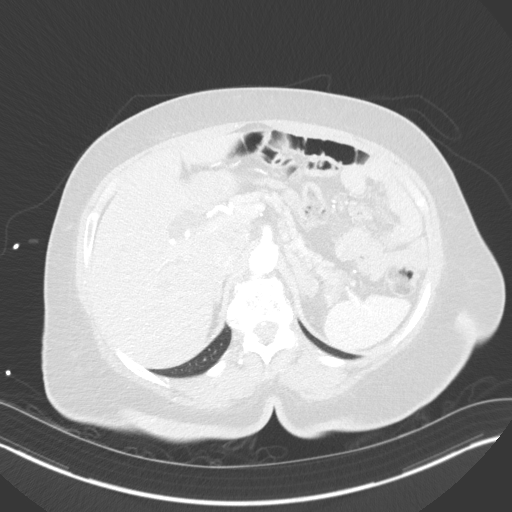
[im 15/93  soft-tissue]
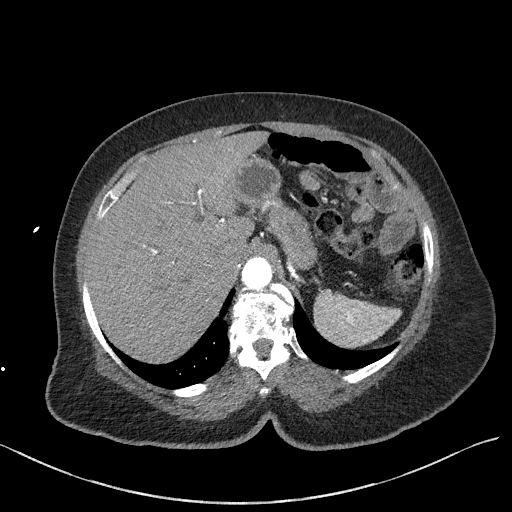
[im 22/93  lung]
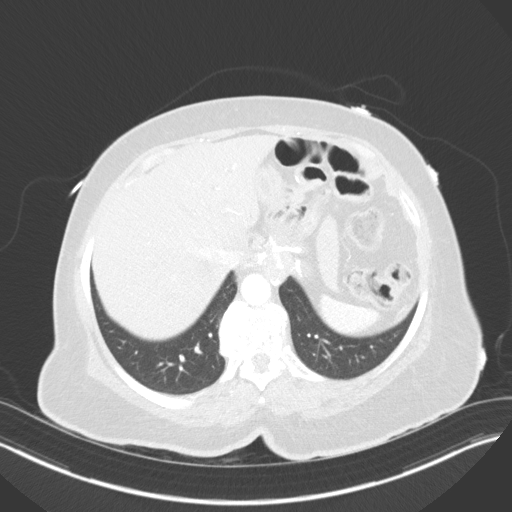
[im 29/93  soft-tissue]
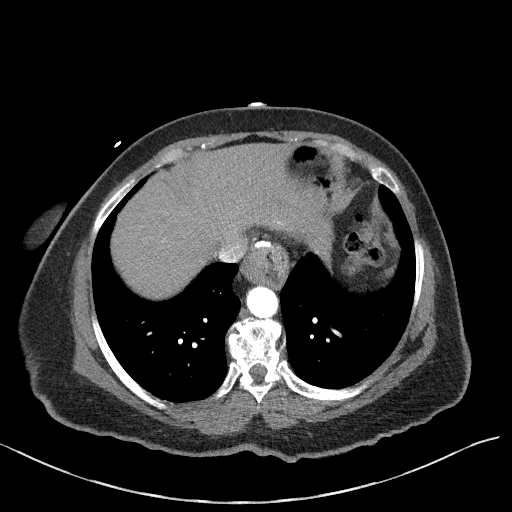
[im 36/93  lung]
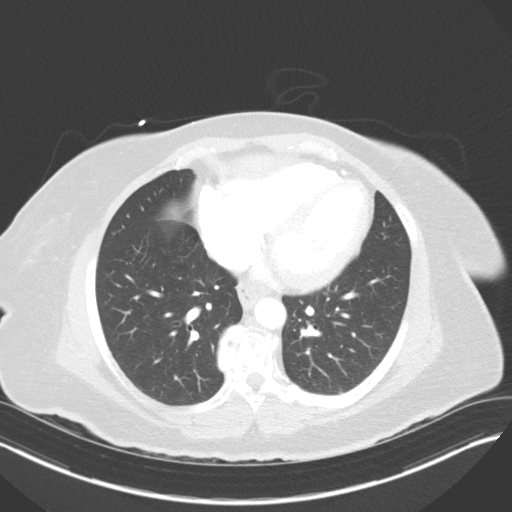
[im 43/93  soft-tissue]
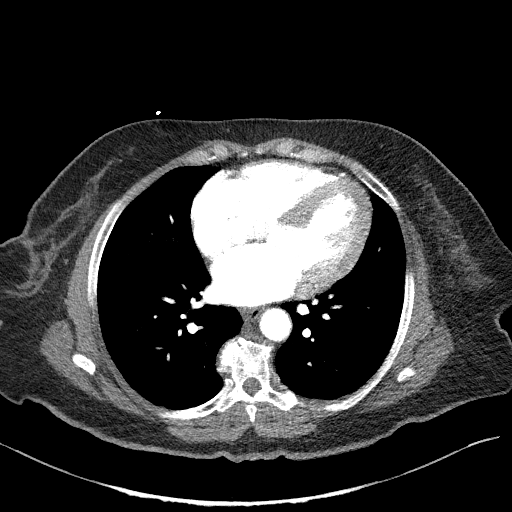
[im 50/93  lung]
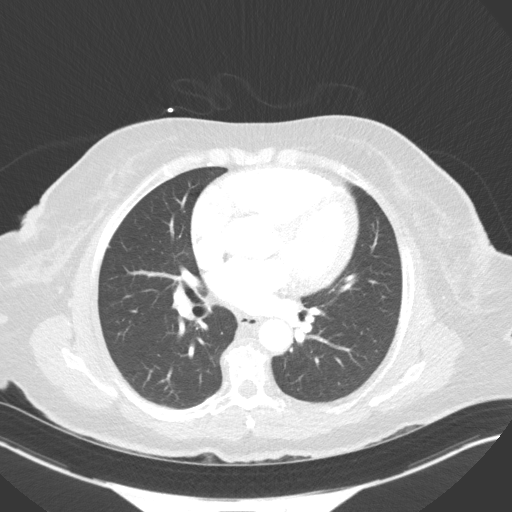
[im 57/93  soft-tissue]
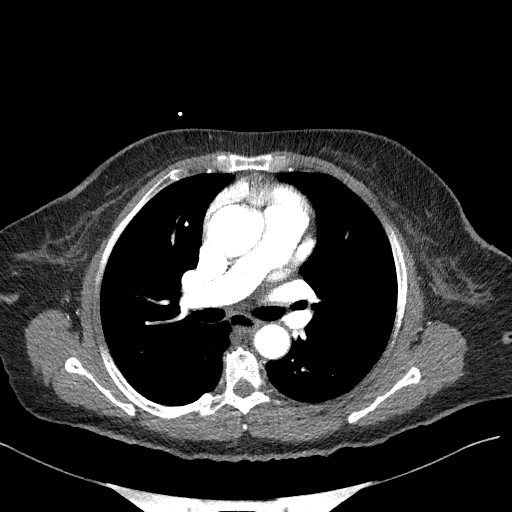
[im 64/93  lung]
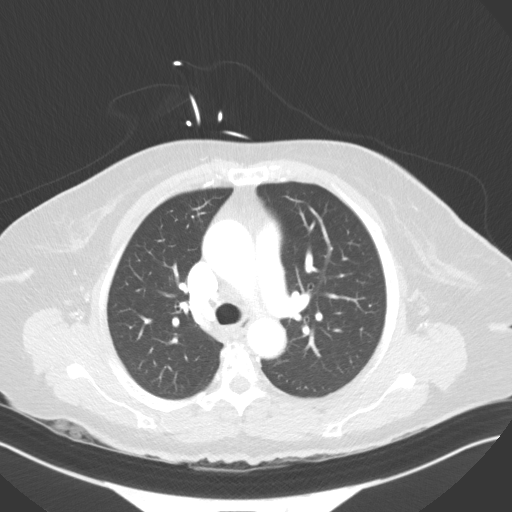
[im 71/93  soft-tissue]
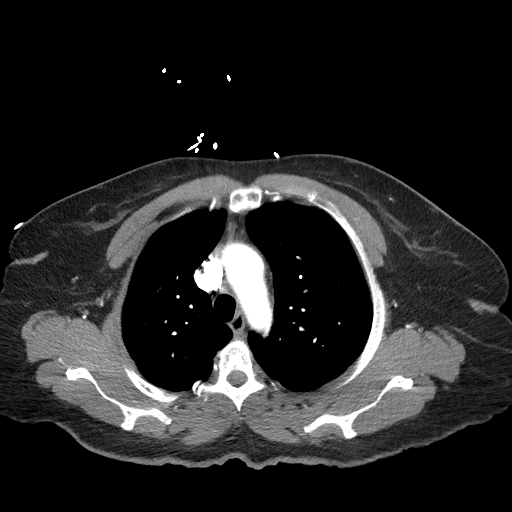
[im 78/93  lung]
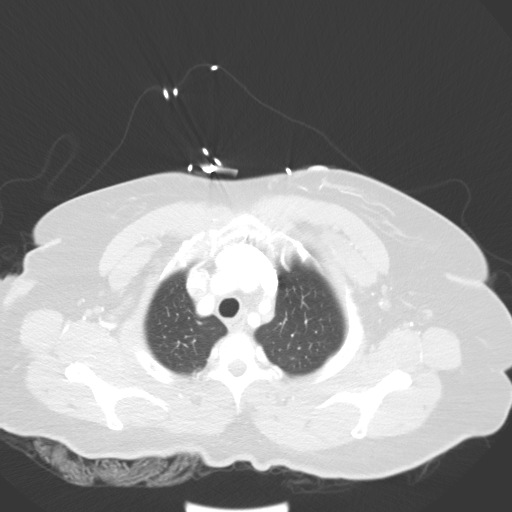
[im 85/93  soft-tissue]
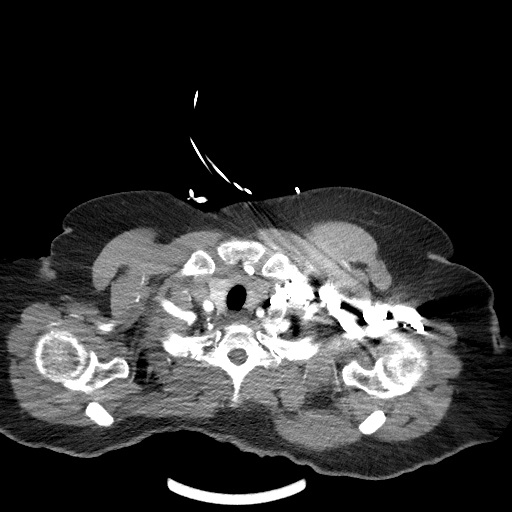

[Series 8: lung · axial · 0.98mm/px · z∈[+882,+922]mm · 2 of 56 slices shown]
[im 8/56  soft-tissue]
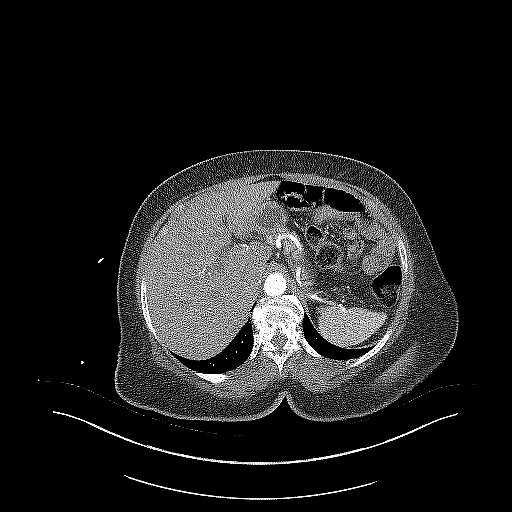
[im 16/56  soft-tissue]
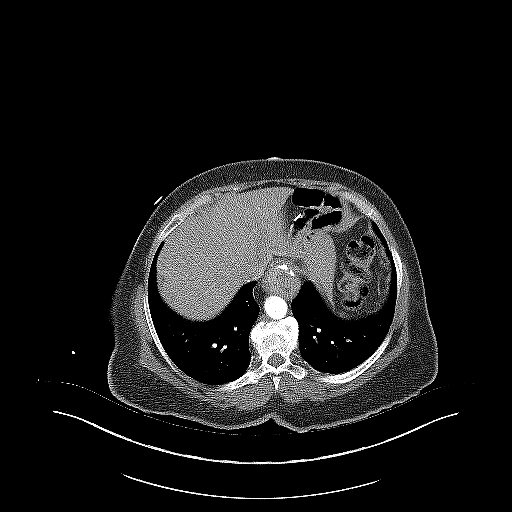

[Series 10: coronals · coronal · 0.55mm/px · 3 of 161 slices shown]
[im 41/161  soft-tissue]
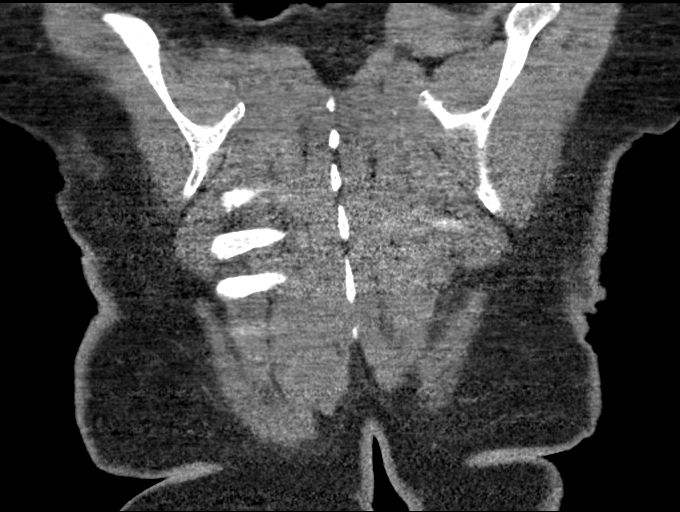
[im 81/161  soft-tissue]
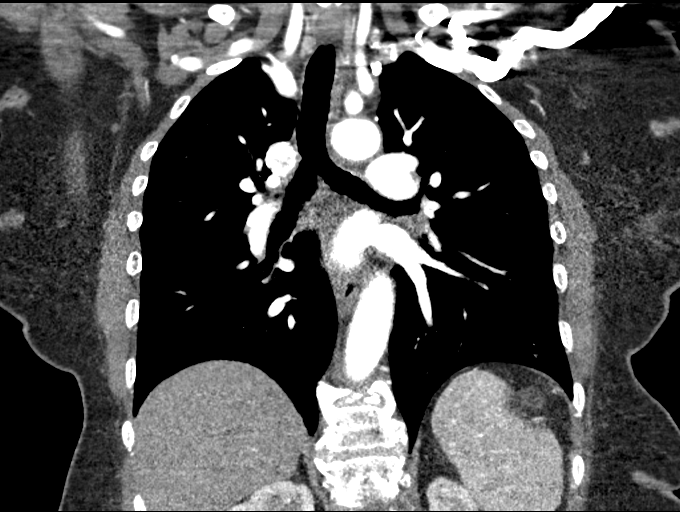
[im 121/161  soft-tissue]
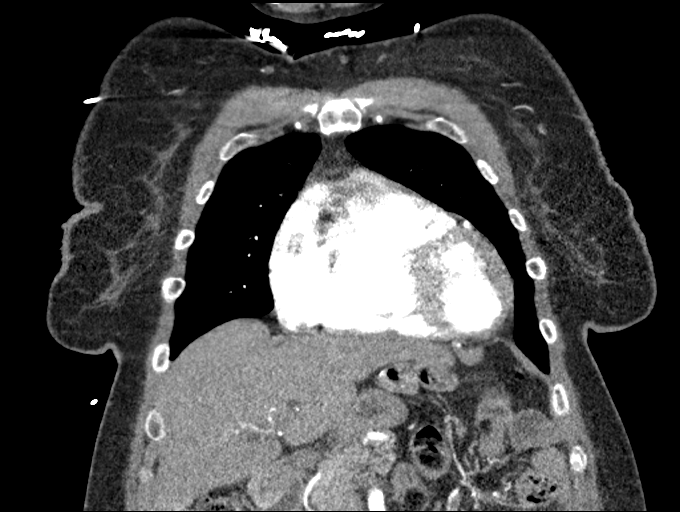

[17 of 46 positions shown; findings below may reference images not displayed]

FINDINGS: Cardiovascular: Precontrast images demonstrate no definite
crescentic high attenuation associated with the wall of the thoracic
aorta to indicate acute intramural hemorrhage. No aneurysm or
dissection of the thoracic aorta. Bovine type thoracic aortic arch
(normal anatomical variant) incidentally noted. Atherosclerotic
calcifications are noted in the descending thoracic aorta as well as
the left anterior descending coronary artery. Mild calcifications of
the aortic valve. Heart size is borderline enlarged. There is no
significant pericardial fluid, thickening or pericardial
calcification.

Mediastinum/Nodes: No pathologically enlarged mediastinal or hilar
lymph nodes. Moderate-sized hiatal hernia. No axillary
lymphadenopathy.

Lungs/Pleura: No suspicious appearing pulmonary nodules or masses
are noted. No acute consolidative airspace disease. No pleural
effusions.

Upper Abdomen: Postoperative changes in the upper abdomen from prior
Roux-en-Y gastric bypass. Status post cholecystectomy. Intra and
extrahepatic biliary ductal dilatation, similar to prior CT the
abdomen and pelvis [DATE], likely reflective of benign post
cholecystectomy physiology. 1.3 cm left adrenal nodule (axial image
86 of series 7).

Musculoskeletal: There are no aggressive appearing lytic or blastic
lesions noted in the visualized portions of the skeleton.

Review of the MIP images confirms the above findings.
IMPRESSION: 1. No acute findings are noted to account for the patient's
symptoms.
2. Moderate-sized hiatal hernia.
3. Aortic atherosclerosis, in addition to left anterior descending
coronary artery disease. Please note that although the presence of
coronary artery calcium documents the presence of coronary artery
disease, the severity of this disease and any potential stenosis
cannot be assessed on this non-gated CT examination. Assessment for
potential risk factor modification, dietary therapy or pharmacologic
therapy may be warranted, if clinically indicated.
4. There are calcifications of the aortic valve. Echocardiographic
correlation for evaluation of potential valvular dysfunction may be
warranted if clinically indicated.
5. Small left adrenal nodule measuring 1.3 cm. Statistically, this
is likely to represent a benign lesions such as a small adenoma.
Follow-up adrenal protocol CT scan could be considered in 12 months
to ensure the stability of this lesion and provide more definitive
characterization. This recommendation follows ACR consensus
guidelines: Management of Incidental Adrenal Masses: A White Paper
of the ACR Incidental Findings Committee. [HOSPITAL]

Aortic Atherosclerosis ([UY]-[UY]).

## 2020-11-23 MED ORDER — ASPIRIN 81 MG PO CHEW
324.0000 mg | CHEWABLE_TABLET | Freq: Once | ORAL | Status: AC
  Administered 2020-11-23: 324 mg via ORAL
  Filled 2020-11-23: qty 4

## 2020-11-23 MED ORDER — HEPARIN BOLUS VIA INFUSION
4000.0000 [IU] | Freq: Once | INTRAVENOUS | Status: AC
  Administered 2020-11-23: 4000 [IU] via INTRAVENOUS

## 2020-11-23 MED ORDER — IOHEXOL 350 MG/ML SOLN
100.0000 mL | Freq: Once | INTRAVENOUS | Status: AC | PRN
  Administered 2020-11-23: 100 mL via INTRAVENOUS

## 2020-11-23 MED ORDER — HEPARIN (PORCINE) 25000 UT/250ML-% IV SOLN
1150.0000 [IU]/h | INTRAVENOUS | Status: DC
  Administered 2020-11-23: 850 [IU]/h via INTRAVENOUS
  Administered 2020-11-24 (×2): 1150 [IU]/h via INTRAVENOUS
  Filled 2020-11-23 (×2): qty 250

## 2020-11-23 NOTE — ED Notes (Signed)
Pt requested crackers to eat. Ok per [employer] pt can have any crackers and something to drink. Pt requested nabs and ginger ale and pt given the same.

## 2020-11-23 NOTE — H&P (Signed)
History and Physical    Faith [name] XIH:[id number] DOB: 28-Apr-1965 DOA: 11/23/2020  PCP: Karle Plumber, MD  Patient coming from: [facility]  I have personally briefly reviewed patient's old medical records in [facility]  Chief Complaint: Chest pain  HPI: Faith [name] is a 55 y.o. female with medical history significant for type 2 diabetes, hypertension, hyperlipidemia who presented to the ED for evaluation of chest pain.  Patient reports new midsternal chest pain beginning 11/22/2020 around afternoon.  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  At the time she did not have any other associated symptoms.  Symptoms lasted about 5 minutes before resolving on their own.  She had another episode on 10/12, this time chest discomfort was described as a pressure-like sensation.  This time she developed diaphoresis and felt fatigued.  Afterwards she felt lightheaded and felt that she was falling to her left side.  She also noted some slurred speech.  The symptoms were occurring while her chest discomfort was ongoing.  Symptoms resolved on their own and she has been chest pain-free since.  She denies any similar chest pain in the past or history of heart disease.  She denies any associated nausea, vomiting, dyspnea, cough, abdominal pain, dysuria, peripheral edema, focal weakness.  She reports intermittent numbness and tingling in both of her hands.  She states the only medication she is currently taking is amlodipine.  She denies any history of tobacco, alcohol, illicit drug use.  She denies any known family history of heart disease in her immediate family.  [facility] ED Course:  Initial vitals show BP 157/87, pulse 62, RR 11, temp 98.5 F, SPO2 100% on room air.  Labs show WBC 5.4, hemoglobin 11.4, platelets 335,000, sodium 138, potassium 4.5, bicarb 26, BUN 18,  creatinine 1.08, serum glucose 86, high-sensitivity troponin 4x2.  2 view chest x-ray negative for focal consolidation, edema, or effusion.  CT head without contrast is negative for acute intracranial process.  CTA chest with contrast is negative for acute findings to account for patient's current symptoms.  Moderate-sized hiatal hernia noted.  Aortic atherosclerosis as well left anterior descending coronary artery disease noted.  Calcifications of the aortic valve seen.  Small left adrenal nodule measuring 1.3 cm also reported.  Patient was given aspirin 324 mg and started on IV heparin. The hospitalist service was consulted to admit for further evaluation and management.  Review of Systems: All systems reviewed and are negative except as documented in history of present illness above.   Past Medical History:  Diagnosis Date   Diabetes mellitus    Hyperlipidemia    Hypertension    Morbid obesity (HCC)    Osteoarthritis, chronic    bilateral knees    Past Surgical History:  Procedure Laterality Date   CHOLECYSTECTOMY     GASTRIC BYPASS     TOTAL ABDOMINAL HYSTERECTOMY     TOTAL KNEE ARTHROPLASTY     TUBAL LIGATION      Social History:  reports that she has never smoked. She has never used smokeless tobacco. She reports that she does not drink alcohol and does not use drugs.  Allergies  Allergen Reactions   Percocet [Oxycodone-Acetaminophen] Anaphylaxis    Family History  Problem Relation Age of Onset   Hypertension Father    Anuerysm Mother 85       mother  died in 70 at age 68 from brain aneurysm   Other Other        estranged from father     Prior to Admission medications   Medication Sig Start Date End Date Taking? Authorizing Provider  ALPRAZolam Prudy Feeler) 0.5 MG tablet 1 tablet by mouth 1/2 hour before MRI 06/11/10   Artist Pais, Doe-Hyun R, DO  amLODipine (NORVASC) 5 MG tablet Take 10 mg by mouth daily.     [provider]  cloNIDine (CATAPRES) 0.1 MG tablet  Take 0.1 mg by mouth 2 (two) times daily.      [provider]  hydrochlorothiazide (MICROZIDE) 12.5 MG capsule Take 1 capsule (12.5 mg total) by mouth every morning. 12/26/10   Artist Pais, Doe-Hyun R, DO  ibuprofen (ADVIL) 400 MG tablet Take 1 tablet (400 mg total) by mouth every 6 (six) hours as needed. 12/14/18   Derwood Kaplan, MD  lisinopril (PRINIVIL,ZESTRIL) 20 MG tablet Take 20 mg by mouth daily.    [provider]  losartan (COZAAR) 100 MG tablet Take 1 tablet (100 mg total) by mouth daily. 08/17/10 08/17/11  Edwyna Perfect, MD  metFORMIN (GLUCOPHAGE-XR) 500 MG 24 hr tablet TAKE ONE (1) TABLET(S) TWICE DAILY 08/31/10   Edwyna Perfect, MD  metoprolol (TOPROL-XL) 50 MG 24 hr tablet Take 1 tablet (50 mg total) by mouth daily. 12/26/10   Artist Pais, Doe-Hyun R, DO  Misc. Devices (HUGO ROLLING WALKER) MISC 1 Device by Does not apply route once. 10/03/10   Lenda Kelp, MD  Multiple Vitamin (MULTIVITAMIN) tablet Take 1 tablet by mouth daily.    [provider]  ondansetron (ZOFRAN) 8 MG tablet Take 1 tablet (8 mg total) by mouth every 4 (four) hours as needed for nausea. 03/24/14   Muthersbaugh, Dahlia Client, PA-C  pravastatin (PRAVACHOL) 40 MG tablet Take 40 mg by mouth daily.      [provider]  promethazine (PHENERGAN) 25 MG tablet Take 1 tablet (25 mg total) by mouth every 6 (six) hours as needed for nausea or vomiting. 12/23/14   Benjiman Core, MD  rOPINIRole (REQUIP) 0.25 MG tablet Take 1 tablet 1-3 hours before bedtime 06/30/15   Harris, Cammy Copa, PA-C  traMADol (ULTRAM) 50 MG tablet Take 1 tablet (50 mg total) by mouth every 6 (six) hours as needed. 06/30/15   Arthor Captain, PA-C    Physical Exam: Vitals:   11/23/20 2000 11/23/20 2100 11/23/20 2241 11/23/20 2324  BP: (!) 152/82 (!) 152/82 (!) 174/123 (!) 154/81  Pulse: (!) 58 (!) 51 62 (!) 54  Resp: 12 13 18    Temp: 98.5 F (36.9 C) 98.5 F (36.9 C) 98.7 F (37.1 C)   TempSrc: Oral Oral    SpO2: 100% 100%  100% 99%  Weight:      Height:       Constitutional: Resting in bed, NAD, calm, comfortable Eyes: PERRL, lids and conjunctivae normal ENMT: Mucous membranes are moist. Posterior pharynx clear of any exudate or lesions.Normal dentition.  Neck: normal, supple, no masses. Respiratory: clear to auscultation bilaterally, no wheezing, no crackles. Normal respiratory effort. No accessory muscle use.  Cardiovascular: Regular rate and rhythm, no murmurs / rubs / gallops. No extremity edema. 2+ pedal pulses. Abdomen: no tenderness, no masses palpated. No hepatosplenomegaly. Bowel sounds positive.  Musculoskeletal: no clubbing / cyanosis. No joint deformity upper and lower extremities. Good ROM, no contractures. Normal muscle tone.  Skin: no rashes, lesions, ulcers. No induration Neurologic: CN 2-12 grossly intact. Sensation intact. Strength 5/5  in all 4.  Psychiatric: Normal judgment and insight. Alert and oriented x 3. Normal mood.   Labs on Admission: I have personally reviewed following labs and imaging studies  CBC: Recent Labs  Lab 11/23/20 1618  WBC 5.4  HGB 11.4*  HCT 34.3*  MCV 91.0  PLT 335   Basic Metabolic Panel: Recent Labs  Lab 11/23/20 1618  NA 138  K 4.5  CL 105  CO2 26  GLUCOSE 86  BUN 18  CREATININE 1.08*  CALCIUM 9.2   GFR: Estimated Creatinine Clearance: 59.5 mL/min (A) (by C-G formula based on SCr of 1.08 mg/dL (H)). Liver Function Tests: No results for input(s): AST, ALT, ALKPHOS, BILITOT, PROT, ALBUMIN in the last 168 hours. No results for input(s): LIPASE, AMYLASE in the last 168 hours. No results for input(s): AMMONIA in the last 168 hours. Coagulation Profile: No results for input(s): INR, PROTIME in the last 168 hours. Cardiac Enzymes: No results for input(s): CKTOTAL, CKMB, CKMBINDEX, TROPONINI in the last 168 hours. BNP (last 3 results) No results for input(s): PROBNP in the last 8760 hours. HbA1C: No results for input(s): HGBA1C in the last 72  hours. CBG: Recent Labs  Lab 11/23/20 2257  GLUCAP 99   Lipid Profile: No results for input(s): CHOL, HDL, LDLCALC, TRIG, CHOLHDL, LDLDIRECT in the last 72 hours. Thyroid Function Tests: No results for input(s): TSH, T4TOTAL, FREET4, T3FREE, THYROIDAB in the last 72 hours. Anemia Panel: No results for input(s): VITAMINB12, FOLATE, FERRITIN, TIBC, IRON, RETICCTPCT in the last 72 hours. Urine analysis:    Component Value Date/Time   COLORURINE YELLOW 12/14/2018 1854   APPEARANCEUR CLEAR 12/14/2018 1854   LABSPEC >1.030 (H) 12/14/2018 1854   PHURINE 5.5 12/14/2018 1854   GLUCOSEU NEGATIVE 12/14/2018 1854   HGBUR NEGATIVE 12/14/2018 1854   HGBUR trace-intact 08/10/2009 1001   BILIRUBINUR SMALL (A) 12/14/2018 1854   BILIRUBINUR Negative 09/11/2010 1027   KETONESUR 15 (A) 12/14/2018 1854   PROTEINUR 100 (A) 12/14/2018 1854   UROBILINOGEN 1.0 12/23/2014 2000   NITRITE NEGATIVE 12/14/2018 1854   LEUKOCYTESUR NEGATIVE 12/14/2018 1854    Radiological Exams on Admission: DG Chest 2 View  Result Date: 11/23/2020 CLINICAL DATA:  Intermittent chest pain since yesterday EXAM: CHEST - 2 VIEW COMPARISON:  01/30/2011 FINDINGS: The heart size and mediastinal contours are within normal limits. Both lungs are clear. The visualized skeletal structures are unremarkable. IMPRESSION: No active cardiopulmonary disease. Electronically Signed   By: Sharlet Salina M.D.   On: 11/23/2020 17:08   CT Head Wo Contrast  Result Date: 11/23/2020 CLINICAL DATA:  Neuro deficit, stroke suspected EXAM: CT HEAD WITHOUT CONTRAST TECHNIQUE: Contiguous axial images were obtained from the base of the skull through the vertex without intravenous contrast. COMPARISON:  12/22/2016 FINDINGS: Brain: No evidence of acute infarction, hemorrhage, cerebral edema, mass, mass effect, or midline shift. Ventricles and sulci are within normal limits for age. No extra-axial fluid collection. Scattered periventricular white matter  changes, likely the sequela of chronic small vessel ischemic disease. Vascular: No hyperdense vessel or unexpected calcification. Skull: Normal. Negative for fracture or focal lesion. Sinuses/Orbits: No acute finding. Other: The mastoid air cells are well aerated. IMPRESSION: No acute intracranial process. Electronically Signed   By: Wiliam Ke M.D.   On: 11/23/2020 17:51   CT Angio Chest Aorta W and/or Wo Contrast  Result Date: 11/23/2020 CLINICAL DATA:  55 year old female with history of chest pain intermittently since yesterday. EXAM: CT ANGIOGRAPHY CHEST WITH CONTRAST TECHNIQUE: Multidetector CT imaging  of the chest was performed using the standard protocol during bolus administration of intravenous contrast. Multiplanar CT image reconstructions and MIPs were obtained to evaluate the vascular anatomy. CONTRAST:  OMNIPAQUE IOHEXOL 350 MG/ML SOLN COMPARISON:  None. FINDINGS: Cardiovascular: Precontrast images demonstrate no definite crescentic high attenuation associated with the wall of the thoracic aorta to indicate acute intramural hemorrhage. No aneurysm or dissection of the thoracic aorta. Bovine type thoracic aortic arch (normal anatomical variant) incidentally noted. Atherosclerotic calcifications are noted in the descending thoracic aorta as well as the left anterior descending coronary artery. Mild calcifications of the aortic valve. Heart size is borderline enlarged. There is no significant pericardial fluid, thickening or pericardial calcification. Mediastinum/Nodes: No pathologically enlarged mediastinal or hilar lymph nodes. Moderate-sized hiatal hernia. No axillary lymphadenopathy. Lungs/Pleura: No suspicious appearing pulmonary nodules or masses are noted. No acute consolidative airspace disease. No pleural effusions. 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 Status post cholecystectomy. Intra and extrahepatic biliary ductal  dilatation, similar to prior CT the abdomen and pelvis 12/23/2014, likely reflective of benign post cholecystectomy physiology. 1.3 cm left adrenal nodule (axial image 86 of series 7). Musculoskeletal: There are no aggressive appearing lytic or blastic lesions noted in the visualized portions of the skeleton. Review of the MIP images confirms the above findings. IMPRESSION: 1. No acute findings are noted to account for the patient's symptoms. 2. Moderate-sized hiatal hernia. 3. Aortic atherosclerosis, in addition to left anterior descending coronary artery disease. Please note that although the presence of coronary artery calcium documents the presence of coronary artery disease, the severity of this disease and any potential stenosis cannot be assessed on this non-gated CT examination. Assessment for potential risk factor modification, dietary therapy or pharmacologic therapy may be warranted, if clinically indicated. 4. There are calcifications of the aortic valve. Echocardiographic correlation for evaluation of potential valvular dysfunction may be warranted if clinically indicated. 5. Small left adrenal nodule measuring 1.3 cm. Statistically, this is likely to represent a benign lesions such as a small adenoma. Follow-up adrenal protocol CT scan could be considered in 12 months to ensure the stability of this lesion and provide more definitive characterization. This recommendation follows ACR consensus guidelines: Management of Incidental Adrenal Masses: A White Paper of the ACR Incidental Findings Committee. J Am Coll Radiol 2017;14:1038-1044. Aortic Atherosclerosis (ICD10-I70.0). Electronically Signed   By: Trudie Reed M.D.   On: 11/23/2020 18:04    EKG: Personally reviewed. Normal sinus rhythm without acute ischemic changes.  Not significantly when compared to prior from 2016.  Assessment/Plan Principal Problem:   Chest pain Active Problems:   Hyperlipidemia associated with type 2 diabetes  mellitus (HCC)   Hypertension associated with diabetes (HCC)   Type 2 diabetes mellitus (HCC)   Adrenal nodule (HCC)   Faith [name] is a 55 y.o. female with medical history significant for type 2 diabetes, hypertension, hyperlipidemia who is admitted for evaluation of chest pain.  Chest pain: Patient presenting with anginal type symptoms.  Troponin negative x2 and EKG without acute ischemic changes.  No prior known cardiac history or previous ischemic work-up.  CTA chest negative negative for acute changes but does show LAD atherosclerosis and aortic valve calcifications. -Continue IV heparin for now -Obtain echocardiogram -Sublingual nitroglycerin as needed -Monitor on telemetry -Consult cardiology in AM for further ischemic work-up recommendations  Hypertension: Continue amlodipine.  Hyperlipidemia: Patient states she is not currently on statin, check lipid panel.  Prediabetes: Check A1c.  Adrenal  nodule: Incidental left 1.3 cm adrenal nodule noted on CT imaging.  Consider follow-up adrenal protocol CT scanning in 12 months.  DVT prophylaxis: IV heparin Code Status: Full code, confirmed with patient Family Communication: Discussed with patient, she has discussed with family Disposition Plan: From home, dispo pending clinical progress Consults called: None Level of care: Progressive Admission status:  Status is: Observation  The patient remains OBS appropriate and will d/c before 2 midnights.  Dispo: The patient is from: Home              Anticipated d/c is to: Home              Patient currently is not medically stable to d/c.   Difficult to place patient No  Darreld Mclean MD [facility]  If 7PM-7AM, please contact night-coverage [email or web address]  11/23/2020, 11:28 PM

## 2020-11-23 NOTE — ED Triage Notes (Signed)
Pt c/o intermittent CP started yesterday-NAD-steady gait

## 2020-11-23 NOTE — Progress Notes (Signed)
ANTICOAGULATION CONSULT NOTE - Initial Consult  Pharmacy Consult for heparin Indication: chest pain/ACS  Allergies  Allergen Reactions   Percocet [Oxycodone-Acetaminophen] Anaphylaxis    Patient Measurements: Height: 5' (152.4 cm) Weight: 95.7 kg (211 lb) IBW/kg (Calculated) : 45.5 Heparin Dosing Weight: 68.5kg  Vital Signs: Temp: 98.5 F (36.9 C) (10/12 1606) Temp Source: Oral (10/12 1606) BP: 154/78 (10/12 1808) Pulse Rate: 56 (10/12 1808)  Labs: Recent Labs    11/23/20 1618 11/23/20 1808  HGB 11.4*  --   HCT 34.3*  --   PLT 335  --   CREATININE 1.08*  --   TROPONINIHS 4 4    Estimated Creatinine Clearance: 59.5 mL/min (A) (by C-G formula based on SCr of 1.08 mg/dL (H)).   Medical History: Past Medical History:  Diagnosis Date   Diabetes mellitus    Hyperlipidemia    Hypertension    Morbid obesity (HCC)    Osteoarthritis, chronic    bilateral knees    Assessment: 33 YOF presenting with CP, hx of HLD, HTN, DM.  She is not on anticoagulation PTA, chronic anemia stable  Goal of Therapy:  Heparin level 0.3-0.7 units/ml Monitor platelets by anticoagulation protocol: Yes   Plan:  Heparin 4000 units IV x 1, and gtt at 850 units/hr F/u 6 hour heparin level F/u cards eval and recs  Daylene Posey, PharmD Clinical Pharmacist ED Pharmacist Phone # [phone] 11/23/2020 6:59 PM

## 2020-11-23 NOTE — ED Provider Notes (Signed)
[facility] EMERGENCY DEPARTMENT Provider Note   CSN: [id number] Arrival date & time: 11/23/20  1600     History Chief Complaint  Patient presents with   Chest Pain    Faith [name] is a 55 y.o. female.   Chest Pain Associated symptoms: no abdominal pain, no back pain and no shortness of breath   Patient presents with chest pain.  Had episode yesterday and again today.  Yesterday began at lunch.  She was resting after work.  States she returned to work but was able to work less because she is felt fatigued and short of breath.  States she got sweaty.  States pain will go from her chest to her left arm and sometimes both arms.  States also go to the back of her neck.  Went to work again today and started work okay but had another episode today where she had to leave work early because she was getting chest pain fatigue and shortness of breath.  States she also felt as if she would pass out.  Reportedly felt lightheaded.  Reportedly states she felt as if she was falling to the left and was having difficulty speaking.  States the chest pain was going on at the time.  Now feels basically back to normal.  Patient no known cardiac history.  No family history of early cardiac disease.  Patient is diabetic and has high cholesterol and hypertension.    Past Medical History:  Diagnosis Date   Diabetes mellitus    Hyperlipidemia    Hypertension    Morbid obesity (HCC)    Osteoarthritis, chronic    bilateral knees    Patient Active Problem List   Diagnosis Date Noted   Unstable angina (HCC) 11/23/2020   Left hip pain 10/05/2010   UTI (lower urinary tract infection) 09/11/2010   Bronchitis 09/11/2010   Neck pain 05/26/2010   HTN (hypertension) 05/26/2010   ACUTE PHARYNGITIS 04/21/2010   PARESTHESIA 09/28/2009   NIPPLE DISCHARGE 08/10/2009   DYSPAREUNIA 08/10/2009   SKIN LESION 08/10/2009   PELVIC  PAIN 08/10/2009   CARBUNCLE, BUTTOCK 05/30/2009   KNEE PAIN 05/30/2009    HYPERLIPIDEMIA 03/28/2009   HYPERTENSION 03/28/2009   GERD 03/28/2009   SHOULDER PAIN, RIGHT 03/28/2009   DIABETES MELLITUS, TYPE II, BORDERLINE 03/28/2009    Past Surgical History:  Procedure Laterality Date   CHOLECYSTECTOMY     GASTRIC BYPASS     TOTAL ABDOMINAL HYSTERECTOMY     TOTAL KNEE ARTHROPLASTY     TUBAL LIGATION       OB History   No obstetric history on file.     Family History  Problem Relation Age of Onset   Hypertension Father    Anuerysm Mother 40       mother died in 19 at age 74 from brain aneurysm   Other Other        estranged from father    Social History   Tobacco Use   Smoking status: Never   Smokeless tobacco: Never  Vaping Use   Vaping Use: Never used  Substance Use Topics   Alcohol use: No   Drug use: No    Home Medications Prior to Admission medications   Medication Sig Start Date End Date Taking? Authorizing Provider  ALPRAZolam Prudy Feeler) 0.5 MG tablet 1 tablet by mouth 1/2 hour before MRI 06/11/10   Artist Pais, Doe-Hyun R, DO  amLODipine (NORVASC) 5 MG tablet Take 10 mg by mouth daily.  [provider]  cloNIDine (CATAPRES) 0.1 MG tablet Take 0.1 mg by mouth 2 (two) times daily.      [provider]  hydrochlorothiazide (MICROZIDE) 12.5 MG capsule Take 1 capsule (12.5 mg total) by mouth every morning. 12/26/10   Artist Pais, Doe-Hyun R, DO  ibuprofen (ADVIL) 400 MG tablet Take 1 tablet (400 mg total) by mouth every 6 (six) hours as needed. 12/14/18   Derwood Kaplan, MD  lisinopril (PRINIVIL,ZESTRIL) 20 MG tablet Take 20 mg by mouth daily.    [provider]  losartan (COZAAR) 100 MG tablet Take 1 tablet (100 mg total) by mouth daily. 08/17/10 08/17/11  Edwyna Perfect, MD  metFORMIN (GLUCOPHAGE-XR) 500 MG 24 hr tablet TAKE ONE (1) TABLET(S) TWICE DAILY 08/31/10   Edwyna Perfect, MD  metoprolol (TOPROL-XL) 50 MG 24 hr tablet Take 1 tablet (50 mg total) by mouth daily. 12/26/10   Artist Pais, Doe-Hyun R, DO  Misc. Devices (HUGO  ROLLING WALKER) MISC 1 Device by Does not apply route once. 10/03/10   Lenda Kelp, MD  Multiple Vitamin (MULTIVITAMIN) tablet Take 1 tablet by mouth daily.    [provider]  ondansetron (ZOFRAN) 8 MG tablet Take 1 tablet (8 mg total) by mouth every 4 (four) hours as needed for nausea. 03/24/14   Muthersbaugh, Dahlia Client, PA-C  pravastatin (PRAVACHOL) 40 MG tablet Take 40 mg by mouth daily.      [provider]  promethazine (PHENERGAN) 25 MG tablet Take 1 tablet (25 mg total) by mouth every 6 (six) hours as needed for nausea or vomiting. 12/23/14   Benjiman Core, MD  rOPINIRole (REQUIP) 0.25 MG tablet Take 1 tablet 1-3 hours before bedtime 06/30/15   Harris, Cammy Copa, PA-C  traMADol (ULTRAM) 50 MG tablet Take 1 tablet (50 mg total) by mouth every 6 (six) hours as needed. 06/30/15   Arthor Captain, PA-C    Allergies    Percocet [oxycodone-acetaminophen]  Review of Systems   Review of Systems  Constitutional:  Negative for appetite change.  HENT:  Negative for congestion.   Respiratory:  Negative for shortness of breath.   Cardiovascular:  Positive for chest pain.  Gastrointestinal:  Negative for abdominal pain.  Genitourinary:  Negative for flank pain.  Musculoskeletal:  Negative for back pain.  Skin:  Negative for rash.  Neurological:  Positive for speech difficulty and light-headedness.  Psychiatric/Behavioral:  Negative for confusion.    Physical Exam Updated Vital Signs BP (!) 174/123 (BP Location: Right Arm)   Pulse 62   Temp 98.7 F (37.1 C)   Resp 18   Ht 5' (1.524 m)   Wt 95.7 kg   SpO2 100%   BMI 41.21 kg/m   Physical Exam Vitals and nursing note reviewed.  HENT:     Head: Normocephalic and atraumatic.  Eyes:     Extraocular Movements: Extraocular movements intact.  Cardiovascular:     Rate and Rhythm: Normal rate and regular rhythm.  Pulmonary:     Breath sounds: No wheezing, rhonchi or rales.  Musculoskeletal:     Cervical back: Neck  supple.     Right lower leg: No tenderness. No edema.     Left lower leg: No tenderness. No edema.  Skin:    General: Skin is warm.     Capillary Refill: Capillary refill takes less than 2 seconds.  Neurological:     General: No focal deficit present.     Mental Status: She is alert and oriented to person, place,  and time.     Motor: No weakness.    ED Results / Procedures / Treatments   Labs (all labs ordered are listed, but only abnormal results are displayed) Labs Reviewed  BASIC METABOLIC PANEL - Abnormal; Notable for the following components:      Result Value   Creatinine, Ser 1.08 (*)    GFR, Estimated 60 (*)    All other components within normal limits  CBC - Abnormal; Notable for the following components:   RBC 3.77 (*)    Hemoglobin 11.4 (*)    HCT 34.3 (*)    All other components within normal limits  RESP PANEL BY RT-PCR (FLU A&B, COVID) ARPGX2  GLUCOSE, CAPILLARY  HEPARIN LEVEL (UNFRACTIONATED)  CBC  TROPONIN I (HIGH SENSITIVITY)  TROPONIN I (HIGH SENSITIVITY)    EKG EKG Interpretation  Date/Time:  Wednesday November 23 2020 16:09:55 EDT Ventricular Rate:  63 PR Interval:  176 QRS Duration: 78 QT Interval:  422 QTC Calculation: 431 R Axis:   46 Text Interpretation: Normal sinus rhythm Cannot rule out Anterior infarct , age undetermined Abnormal ECG Confirmed by Benjiman Core [phone]) on 11/23/2020 4:17:03 PM  Radiology DG Chest 2 View  Result Date: 11/23/2020 CLINICAL DATA:  Intermittent chest pain since yesterday EXAM: CHEST - 2 VIEW COMPARISON:  01/30/2011 FINDINGS: The heart size and mediastinal contours are within normal limits. Both lungs are clear. The visualized skeletal structures are unremarkable. IMPRESSION: No active cardiopulmonary disease. Electronically Signed   By: Sharlet Salina M.D.   On: 11/23/2020 17:08   CT Head Wo Contrast  Result Date: 11/23/2020 CLINICAL DATA:  Neuro deficit, stroke suspected EXAM: CT HEAD WITHOUT CONTRAST  TECHNIQUE: Contiguous axial images were obtained from the base of the skull through the vertex without intravenous contrast. COMPARISON:  12/22/2016 FINDINGS: Brain: No evidence of acute infarction, hemorrhage, cerebral edema, mass, mass effect, or midline shift. Ventricles and sulci are within normal limits for age. No extra-axial fluid collection. Scattered periventricular white matter changes, likely the sequela of chronic small vessel ischemic disease. Vascular: No hyperdense vessel or unexpected calcification. Skull: Normal. Negative for fracture or focal lesion. Sinuses/Orbits: No acute finding. Other: The mastoid air cells are well aerated. IMPRESSION: No acute intracranial process. Electronically Signed   By: Wiliam Ke M.D.   On: 11/23/2020 17:51   CT Angio Chest Aorta W and/or Wo Contrast  Result Date: 11/23/2020 CLINICAL DATA:  55 year old female with history of chest pain intermittently since yesterday. EXAM: CT ANGIOGRAPHY CHEST WITH CONTRAST TECHNIQUE: Multidetector CT imaging of the chest was performed using the standard protocol during bolus administration of intravenous contrast. Multiplanar CT image reconstructions and MIPs were obtained to evaluate the vascular anatomy. CONTRAST:  OMNIPAQUE IOHEXOL 350 MG/ML SOLN COMPARISON:  None. FINDINGS: Cardiovascular: Precontrast images demonstrate no definite crescentic high attenuation associated with the wall of the thoracic aorta to indicate acute intramural hemorrhage. No aneurysm or dissection of the thoracic aorta. Bovine type thoracic aortic arch (normal anatomical variant) incidentally noted. Atherosclerotic calcifications are noted in the descending thoracic aorta as well as the left anterior descending coronary artery. Mild calcifications of the aortic valve. Heart size is borderline enlarged. There is no significant pericardial fluid, thickening or pericardial calcification. Mediastinum/Nodes: No pathologically enlarged mediastinal  or hilar lymph nodes. Moderate-sized hiatal hernia. No axillary lymphadenopathy. Lungs/Pleura: No suspicious appearing pulmonary nodules or masses are noted. No acute consolidative airspace disease. No pleural effusions. 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 Status post cholecystectomy. Intra and extrahepatic biliary ductal dilatation, similar to prior CT the abdomen and pelvis 12/23/2014, likely reflective of benign post cholecystectomy physiology. 1.3 cm left adrenal nodule (axial image 86 of series 7). Musculoskeletal: There are no aggressive appearing lytic or blastic lesions noted in the visualized portions of the skeleton. Review of the MIP images confirms the above findings. IMPRESSION: 1. No acute findings are noted to account for the patient's symptoms. 2. Moderate-sized hiatal hernia. 3. Aortic atherosclerosis, in addition to left anterior descending coronary artery disease. Please note that although the presence of coronary artery calcium documents the presence of coronary artery disease, the severity of this disease and any potential stenosis cannot be assessed on this non-gated CT examination. Assessment for potential risk factor modification, dietary therapy or pharmacologic therapy may be warranted, if clinically indicated. 4. There are calcifications of the aortic valve. Echocardiographic correlation for evaluation of potential valvular dysfunction may be warranted if clinically indicated. 5. Small left adrenal nodule measuring 1.3 cm. Statistically, this is likely to represent a benign lesions such as a small adenoma. Follow-up adrenal protocol CT scan could be considered in 12 months to ensure the stability of this lesion and provide more definitive characterization. This recommendation follows ACR consensus guidelines: Management of Incidental Adrenal Masses: A White Paper of the ACR Incidental Findings Committee. J Am Coll Radiol  2017;14:1038-1044. Aortic Atherosclerosis (ICD10-I70.0). Electronically Signed   By: Trudie Reed M.D.   On: 11/23/2020 18:04    Procedures Procedures   Medications Ordered in ED Medications  heparin ADULT infusion 100 units/mL (25000 units/271mL) (850 Units/hr Intravenous Handoff 11/23/20 2224)  iohexol (OMNIPAQUE) 350 MG/ML injection 100 mL (100 mLs Intravenous Contrast Given 11/23/20 1706)  aspirin chewable tablet 324 mg (324 mg Oral Given 11/23/20 1900)  heparin bolus via infusion 4,000 Units (4,000 Units Intravenous Bolus from Bag 11/23/20 1926)    ED Course  I have reviewed the triage vital signs and the nursing notes.  Pertinent labs & imaging results that were available during my care of the patient were reviewed by me and considered in my medical decision making (see chart for details).    MDM Rules/Calculators/A&P                           Patient with chest pain.  Episodes yesterday and today.  Is exertional.  States she cannot do the same activity today with it.  Pain-free at this time however.  States she had an episode when it was going on where she felt lightheaded and felt as if she could pass out.  With the episode reportedly also had difficulty speaking and felt as if she was falling to the left.  Reportedly was somewhat sweaty 2.  That also resolved.  With exertional chest pain and potential neurologic deficits I feel the patient benefit from mission of the hospital.  Will discuss with hospitalist. Head CT reassuring.  CTA of the chest did not show any aortic dissection, but did show some LAD atherosclerosis.+ We will treat for unstable angina with heparin drip.   CRITICAL CARE Performed by: Benjiman Core Total critical care time: 30 minutes Critical care time was exclusive of separately billable procedures and treating other patients. Critical care was necessary to treat or prevent imminent or life-threatening deterioration. Critical care was time spent  personally by me on the following activities: development of treatment plan with patient and/or surrogate as well as nursing, discussions with  consultants, evaluation of patient's response to treatment, examination of patient, obtaining history from patient or surrogate, ordering and performing treatments and interventions, ordering and review of laboratory studies, ordering and review of radiographic studies, pulse oximetry and re-evaluation of patient's condition.    Final diagnoses:  Difficulty speaking  Unstable angina [facility])    Rx / DC Orders ED Discharge Orders     None        Benjiman Core, MD 11/23/20 2321

## 2020-11-24 ENCOUNTER — Ambulatory Visit (HOSPITAL_COMMUNITY): Payer: BC Managed Care – PPO

## 2020-11-24 DIAGNOSIS — R079 Chest pain, unspecified: Secondary | ICD-10-CM | POA: Diagnosis not present

## 2020-11-24 LAB — BASIC METABOLIC PANEL
Anion gap: 8 (ref 5–15)
BUN: 11 mg/dL (ref 6–20)
CO2: 25 mmol/L (ref 22–32)
Calcium: 9.1 mg/dL (ref 8.9–10.3)
Chloride: 104 mmol/L (ref 98–111)
Creatinine, Ser: 0.94 mg/dL (ref 0.44–1.00)
GFR, Estimated: >60 mL/min (ref >60)
Glucose, Bld: 85 mg/dL (ref 70–99)
Potassium: 3.8 mmol/L (ref 3.5–5.1)
Sodium: 137 mmol/L (ref 135–145)

## 2020-11-24 LAB — CBC
HCT: 32.1 % — ABNORMAL LOW (ref 36.0–46.0)
Hemoglobin: 10.6 g/dL — ABNORMAL LOW (ref 12.0–15.0)
MCH: 29.9 pg (ref 26.0–34.0)
MCHC: 33 g/dL (ref 30.0–36.0)
MCV: 90.4 fL (ref 80.0–100.0)
Platelets: 328 10*3/uL (ref 150–400)
RBC: 3.55 MIL/uL — ABNORMAL LOW (ref 3.87–5.11)
RDW: 12.9 % (ref 11.5–15.5)
WBC: 4.8 10*3/uL (ref 4.0–10.5)
nRBC: 0 % (ref 0.0–0.2)

## 2020-11-24 LAB — ECHOCARDIOGRAM COMPLETE
Area-P 1/2: 3.42 cm2
Height: 60 in
S' Lateral: 2.6 cm
Weight: 3376 oz

## 2020-11-24 LAB — TROPONIN I (HIGH SENSITIVITY): Troponin I (High Sensitivity): 8 ng/L (ref <18)

## 2020-11-24 LAB — HEPARIN LEVEL (UNFRACTIONATED)
Heparin Unfractionated: 0.25 IU/mL — ABNORMAL LOW (ref 0.30–0.70)
Heparin Unfractionated: 0.24 IU/mL — ABNORMAL LOW (ref 0.30–0.70)
Heparin Unfractionated: <0.1 IU/mL — ABNORMAL LOW (ref 0.30–0.70)

## 2020-11-24 LAB — HEMOGLOBIN A1C
Hgb A1c MFr Bld: 5.3 % (ref 4.8–5.6)
Mean Plasma Glucose: 105.41 mg/dL

## 2020-11-24 LAB — HIV ANTIBODY (ROUTINE TESTING W REFLEX): HIV Screen 4th Generation wRfx: NONREACTIVE

## 2020-11-24 MED ORDER — NITROGLYCERIN 0.4 MG SL SUBL: 0.4000 mg | SUBLINGUAL_TABLET | SUBLINGUAL | Status: DC | PRN

## 2020-11-24 MED ORDER — LORAZEPAM 0.5 MG PO TABS
0.5000 mg | ORAL_TABLET | Freq: Once | ORAL | Status: AC | PRN
  Administered 2020-11-25: 0.5 mg via ORAL
  Filled 2020-11-24: qty 1

## 2020-11-24 MED ORDER — IBUPROFEN 200 MG PO TABS
200.0000 mg | ORAL_TABLET | Freq: Four times a day (QID) | ORAL | Status: DC | PRN
  Administered 2020-11-24 (×3): 200 mg via ORAL
  Filled 2020-11-24 (×3): qty 1

## 2020-11-24 MED ORDER — AMLODIPINE BESYLATE 10 MG PO TABS
10.0000 mg | ORAL_TABLET | Freq: Every day | ORAL | Status: DC
  Administered 2020-11-24 – 2020-11-25 (×2): 10 mg via ORAL
  Filled 2020-11-24 (×2): qty 1

## 2020-11-24 MED ORDER — HEPARIN BOLUS VIA INFUSION
1000.0000 [IU] | Freq: Once | INTRAVENOUS | Status: AC
  Administered 2020-11-24: 1000 [IU] via INTRAVENOUS
  Filled 2020-11-24: qty 1000

## 2020-11-24 MED ORDER — ASPIRIN EC 81 MG PO TBEC
81.0000 mg | DELAYED_RELEASE_TABLET | Freq: Every day | ORAL | Status: DC
  Administered 2020-11-24 – 2020-11-25 (×2): 81 mg via ORAL
  Filled 2020-11-24 (×2): qty 1

## 2020-11-24 MED ORDER — MELATONIN 5 MG PO TABS
5.0000 mg | ORAL_TABLET | Freq: Every evening | ORAL | Status: DC | PRN
  Administered 2020-11-24: 5 mg via ORAL
  Filled 2020-11-24: qty 1

## 2020-11-24 MED ORDER — ONDANSETRON HCL 4 MG/2ML IJ SOLN: 4.0000 mg | Freq: Four times a day (QID) | INTRAMUSCULAR | Status: DC | PRN

## 2020-11-24 NOTE — Progress Notes (Signed)
ANTICOAGULATION CONSULT NOTE  Pharmacy Consult for heparin Indication: chest pain/ACS  Allergies  Allergen Reactions   Percocet [Oxycodone-Acetaminophen] Anaphylaxis    Patient Measurements: Height: 5' (152.4 cm) Weight: 95.7 kg (211 lb) IBW/kg (Calculated) : 45.5 Heparin Dosing Weight: 68.5kg  Vital Signs: Temp: 98.7 F (37.1 C) (10/12 2241) Temp Source: Oral (10/12 2100) BP: 154/81 (10/12 2324) Pulse Rate: 54 (10/12 2324)  Labs: Recent Labs    11/23/20 1618 11/23/20 1808 11/24/20 0256  HGB 11.4*  --  10.6*  HCT 34.3*  --  32.1*  PLT 335  --  328  HEPARINUNFRC  --   --  0.25*  CREATININE 1.08*  --  0.94  TROPONINIHS 4 4  --      Estimated Creatinine Clearance: 68.4 mL/min (by C-G formula based on SCr of 0.94 mg/dL).   Medical History: Past Medical History:  Diagnosis Date   Diabetes mellitus    Hyperlipidemia    Hypertension    Morbid obesity (HCC)    Osteoarthritis, chronic    bilateral knees    Assessment: 36 YOF presenting with CP, hx of HLD, HTN, DM.  She is not on anticoagulation PTA, chronic anemia stable.  Heparin level slightly subtherapeutic (0.25) on gtt at 850 units/hr. No issues with line or bleeding reported per RN.  Goal of Therapy:  Heparin level 0.3-0.7 units/ml Monitor platelets by anticoagulation protocol: Yes   Plan:  Increase heparin to 1000 units/hr F/u 6 hr heparin level  Christoper Fabian, PharmD, BCPS Please see amion for complete clinical pharmacist phone list 11/24/2020 4:36 AM

## 2020-11-24 NOTE — Progress Notes (Signed)
  Echocardiogram 2D Echocardiogram has been performed.  Delcie Roch 11/24/2020, 11:46 AM

## 2020-11-24 NOTE — Progress Notes (Signed)
ANTICOAGULATION CONSULT NOTE  Pharmacy Consult for heparin Indication: chest pain/ACS  Allergies  Allergen Reactions   Percocet [Oxycodone-Acetaminophen] Anaphylaxis   Propoxyphene Swelling   Nsaids     Other reaction(s): Other (See Comments) Cannot take due to gastric bypass Cannot take due to gastric bypass     Patient Measurements: Height: 5' (152.4 cm) Weight: 95.7 kg (211 lb) IBW/kg (Calculated) : 45.5 Heparin Dosing Weight: 68.5kg  Vital Signs: Temp: 98.7 F (37.1 C) (10/13 0455) Temp Source: Oral (10/13 0455) BP: 160/97 (10/13 0455) Pulse Rate: 66 (10/13 0455)  Labs: Recent Labs    11/23/20 1618 11/23/20 1808 11/24/20 0256 11/24/20 1039  HGB 11.4*  --  10.6*  --   HCT 34.3*  --  32.1*  --   PLT 335  --  328  --   HEPARINUNFRC  --   --  0.25* 0.24*  CREATININE 1.08*  --  0.94  --   TROPONINIHS 4 4 8   --      Estimated Creatinine Clearance: 68.4 mL/min (by C-G formula based on SCr of 0.94 mg/dL).   Medical History: Past Medical History:  Diagnosis Date   Diabetes mellitus    Hyperlipidemia    Hypertension    Morbid obesity (HCC)    Osteoarthritis, chronic    bilateral knees    Assessment: 5 YOF presenting with CP, hx of HLD, HTN, DM.  She is not on anticoagulation PTA, chronic anemia stable.  Heparin level slightly subtherapeutic (0.24) on gtt at 1000 units/hr.   Goal of Therapy:  Heparin level 0.3-0.7 units/ml Monitor platelets by anticoagulation protocol: Yes   Plan:  -Heparin 1000 unit bolus and increase to 1150 units/hr -Heparin level in 6 hours and daily wth CBC daily  56, PharmD Clinical Pharmacist **Pharmacist phone directory can now be found on [email or web address] ([email or web address]).  Listed under [facility].

## 2020-11-24 NOTE — Progress Notes (Signed)
PROGRESS NOTE    Faith [name]  LNL:[id number] DOB: [birth year] DOA: 11/23/2020 PCP: Karle Plumber, MD    Chief Complaint  Patient presents with   Chest Pain    Brief Narrative:  Faith [name] is a 55 y.o. female with medical history significant for type 2 diabetes, hypertension, hyperlipidemia who presented to the ED for evaluation of chest pain  Subjective:  Slurred speech, left side weakness , dizziness has resolved Has chest pain x2 once on teusday, once wenedesday, each last about , started have persistent chest pressure since teusday Currently no chest pain, continue to have chest pressure Tolerating heparin drip  Assessment & Plan:   Principal Problem:   Chest pain Active Problems:   Hyperlipidemia   Essential hypertension   Adrenal nodule (HCC)   Chest pain CTA chest with contrast is negative for acute findings to account for patient's current symptoms.  Moderate-sized hiatal hernia noted.  Aortic atherosclerosis as well left anterior descending coronary artery disease noted.  Calcifications of the aortic valve seen.  Small left adrenal nodule measuring 1.3 cm also reported. Troponin negative Continue heparin drip, asa Check lipid panel Cardiology consulted ,plan to have stress test tomorrow Will follow cards recommendation  HTN Continue norvasc    Class III obesity: Body mass index is 41.21 kg/m.Marland Kitchen      Unresulted Labs (From admission, onward)     Start     Ordered   11/25/20 0500  Heparin level (unfractionated)  Daily,   R     See Hyperspace for full Linked Orders Report.   11/23/20 2248   11/25/20 0500  Lipid panel  Tomorrow morning,   R       Question:  Specimen collection method  Answer:  Lab=Lab collect   11/24/20 0017   11/24/20 0500  CBC  Daily,   R     See Hyperspace for full Linked Orders Report.   11/23/20 2248              DVT prophylaxis: on heparin drip   Code Status:full Family Communication:  patient Disposition:   Status is: Observation   Dispo: The patient is from: home              Anticipated d/c is to: home              Anticipated d/c date is: TBD                Consultants:  cardiology  Procedures:  Plan to have cardiac stress test on 10/14  Antimicrobials:   Anti-infectives (From admission, onward)    None           Objective: Vitals:   11/23/20 2100 11/23/20 2241 11/23/20 2324 11/24/20 0455  BP: (!) 152/82 (!) 174/123 (!) 154/81 (!) 160/97  Pulse: (!) 51 62 (!) 54 66  Resp: 13 18  19   Temp: 98.5 F (36.9 C) 98.7 F (37.1 C)  98.7 F (37.1 C)  TempSrc: Oral   Oral  SpO2: 100% 100% 99% 97%  Weight:      Height:        Intake/Output Summary (Last 24 hours) at 11/24/2020 1201 Last data filed at 11/24/2020 0700 Gross per 24 hour  Intake 139.74 ml  Output 1650 ml  Net -1510.26 ml   Filed Weights   11/23/20 1606  Weight: 95.7 kg    Examination:  General exam: alert, awake, communicative,calm, NAD Respiratory system:  Respiratory effort normal. Cardiovascular system:  RRR.  Gastrointestinal system: Abdomen is nondistended, soft and nontender.  Normal bowel sounds  Central nervous system: Alert and oriented. No focal neurological deficits. Extremities:  no edema Skin: No rashes, lesions or ulcers Psychiatry: Judgement and insight appear normal. Mood & affect appropriate.     Data Reviewed: I have personally reviewed following labs and imaging studies  CBC: Recent Labs  Lab 11/23/20 1618 11/24/20 0256  WBC 5.4 4.8  HGB 11.4* 10.6*  HCT 34.3* 32.1*  MCV 91.0 90.4  PLT 335 328    Basic Metabolic Panel: Recent Labs  Lab 11/23/20 1618 11/24/20 0256  NA 138 137  K 4.5 3.8  CL 105 104  CO2 26 25  GLUCOSE 86 85  BUN 18 11  CREATININE 1.08* 0.94  CALCIUM 9.2 9.1    GFR: Estimated Creatinine Clearance: 68.4 mL/min (by C-G formula based on SCr of 0.94 mg/dL).  Liver Function Tests: No results for input(s): AST,  ALT, ALKPHOS, BILITOT, PROT, ALBUMIN in the last 168 hours.  CBG: Recent Labs  Lab 11/23/20 2257  GLUCAP 99     Recent Results (from the past 240 hour(s))  Resp Panel by RT-PCR (Flu A&B, Covid) Nasopharyngeal Swab     Status: None   Collection Time: 11/23/20  4:40 PM   Specimen: Nasopharyngeal Swab; Nasopharyngeal(NP) swabs in vial transport medium  Result Value Ref Range Status   SARS Coronavirus 2 by RT PCR NEGATIVE NEGATIVE Final    Comment: (NOTE) SARS-CoV-2 target nucleic acids are NOT DETECTED.  The SARS-CoV-2 RNA is generally detectable in upper respiratory specimens during the acute phase of infection. The lowest concentration of SARS-CoV-2 viral copies this assay can detect is 138 copies/mL. A negative result does not preclude SARS-Cov-2 infection and should not be used as the sole basis for treatment or other patient management decisions. A negative result may occur with  improper specimen collection/handling, submission of specimen other than nasopharyngeal swab, presence of viral mutation(s) within the areas targeted by this assay, and inadequate number of viral copies(<138 copies/mL). A negative result must be combined with clinical observations, patient history, and epidemiological information. The expected result is Negative.  Fact Sheet for Patients:  BloggerCourse.com  Fact Sheet for Healthcare Providers:  SeriousBroker.it  This test is no t yet approved or cleared by the [place] FDA and  has been authorized for detection and/or diagnosis of SARS-CoV-2 by FDA under an Emergency Use Authorization (EUA). This EUA will remain  in effect (meaning this test can be used) for the duration of the COVID-19 declaration under Section 564(b)(1) of the Act, 21 U.S.C.section 360bbb-3(b)(1), unless the authorization is terminated  or revoked sooner.       Influenza A by PCR NEGATIVE NEGATIVE Final   Influenza B  by PCR NEGATIVE NEGATIVE Final    Comment: (NOTE) The Xpert Xpress SARS-CoV-2/FLU/RSV plus assay is intended as an aid in the diagnosis of influenza from Nasopharyngeal swab specimens and should not be used as a sole basis for treatment. Nasal washings and aspirates are unacceptable for Xpert Xpress SARS-CoV-2/FLU/RSV testing.  Fact Sheet for Patients: BloggerCourse.com  Fact Sheet for Healthcare Providers: SeriousBroker.it  This test is not yet approved or cleared by the [place] FDA and has been authorized for detection and/or diagnosis of SARS-CoV-2 by FDA under an Emergency Use Authorization (EUA). This EUA will remain in effect (meaning this test can be used) for the duration of the COVID-19 declaration under Section 564(b)(1) of the Act, 21 U.S.C. section 360bbb-3(b)(1),  unless the authorization is terminated or revoked.  Performed at [facility], [address] [place], [place] [address]          Radiology Studies: DG Chest 2 View  Result Date: 11/23/2020 CLINICAL DATA:  Intermittent chest pain since yesterday EXAM: CHEST - 2 VIEW COMPARISON:  01/30/2011 FINDINGS: The heart size and mediastinal contours are within normal limits. Both lungs are clear. The visualized skeletal structures are unremarkable. IMPRESSION: No active cardiopulmonary disease. Electronically Signed   By: Sharlet Salina M.D.   On: 11/23/2020 17:08   CT Head Wo Contrast  Result Date: 11/23/2020 CLINICAL DATA:  Neuro deficit, stroke suspected EXAM: CT HEAD WITHOUT CONTRAST TECHNIQUE: Contiguous axial images were obtained from the base of the skull through the vertex without intravenous contrast. COMPARISON:  12/22/2016 FINDINGS: Brain: No evidence of acute infarction, hemorrhage, cerebral edema, mass, mass effect, or midline shift. Ventricles and sulci are within normal limits for age. No extra-axial fluid collection. Scattered  periventricular white matter changes, likely the sequela of chronic small vessel ischemic disease. Vascular: No hyperdense vessel or unexpected calcification. Skull: Normal. Negative for fracture or focal lesion. Sinuses/Orbits: No acute finding. Other: The mastoid air cells are well aerated. IMPRESSION: No acute intracranial process. Electronically Signed   By: Wiliam Ke M.D.   On: 11/23/2020 17:51   CT Angio Chest Aorta W and/or Wo Contrast  Result Date: 11/23/2020 CLINICAL DATA:  55 year old female with history of chest pain intermittently since yesterday. EXAM: CT ANGIOGRAPHY CHEST WITH CONTRAST TECHNIQUE: Multidetector CT imaging of the chest was performed using the standard protocol during bolus administration of intravenous contrast. Multiplanar CT image reconstructions and MIPs were obtained to evaluate the vascular anatomy. CONTRAST:  OMNIPAQUE IOHEXOL 350 MG/ML SOLN COMPARISON:  None. FINDINGS: Cardiovascular: Precontrast images demonstrate no definite crescentic high attenuation associated with the wall of the thoracic aorta to indicate acute intramural hemorrhage. No aneurysm or dissection of the thoracic aorta. Bovine type thoracic aortic arch (normal anatomical variant) incidentally noted. Atherosclerotic calcifications are noted in the descending thoracic aorta as well as the left anterior descending coronary artery. Mild calcifications of the aortic valve. Heart size is borderline enlarged. There is no significant pericardial fluid, thickening or pericardial calcification. Mediastinum/Nodes: No pathologically enlarged mediastinal or hilar lymph nodes. Moderate-sized hiatal hernia. No axillary lymphadenopathy. Lungs/Pleura: No suspicious appearing pulmonary nodules or masses are noted. No acute consolidative airspace disease. No pleural effusions. 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 Status post cholecystectomy. Intra and  extrahepatic biliary ductal dilatation, similar to prior CT the abdomen and pelvis 12/23/2014, likely reflective of benign post cholecystectomy physiology. 1.3 cm left adrenal nodule (axial image 86 of series 7). Musculoskeletal: There are no aggressive appearing lytic or blastic lesions noted in the visualized portions of the skeleton. Review of the MIP images confirms the above findings. IMPRESSION: 1. No acute findings are noted to account for the patient's symptoms. 2. Moderate-sized hiatal hernia. 3. Aortic atherosclerosis, in addition to left anterior descending coronary artery disease. Please note that although the presence of coronary artery calcium documents the presence of coronary artery disease, the severity of this disease and any potential stenosis cannot be assessed on this non-gated CT examination. Assessment for potential risk factor modification, dietary therapy or pharmacologic therapy may be warranted, if clinically indicated. 4. There are calcifications of the aortic valve. Echocardiographic correlation for evaluation of potential valvular dysfunction may be warranted if clinically indicated. 5. Small  left adrenal nodule measuring 1.3 cm. Statistically, this is likely to represent a benign lesions such as a small adenoma. Follow-up adrenal protocol CT scan could be considered in 12 months to ensure the stability of this lesion and provide more definitive characterization. This recommendation follows ACR consensus guidelines: Management of Incidental Adrenal Masses: A White Paper of the ACR Incidental Findings Committee. J Am Coll Radiol 2017;14:1038-1044. Aortic Atherosclerosis (ICD10-I70.0). Electronically Signed   By: Trudie Reed M.D.   On: 11/23/2020 18:04        Scheduled Meds:  amLODipine  10 mg Oral Daily   aspirin EC  81 mg Oral Daily   Continuous Infusions:  heparin 1,000 Units/hr (11/24/20 0700)     LOS: 0 days   Time spent: Greater than 50% of this time was  spent in counseling, explanation of diagnosis, planning of further management, and coordination of care.   Voice Recognition Reubin [place] dictation system was used to create this note, attempts have been made to correct errors. Please contact the author with questions and/or clarifications.   Albertine Grates, MD PhD FACP [facility]  Available via Epic secure chat 7am-7pm for nonurgent issues Please page for urgent issues To page the attending provider between 7A-7P or the covering provider during after hours 7P-7A, please log into the web site [email or web address] and access using universal [place] password for that web site. If you do not have the password, please call the hospital operator.    11/24/2020, 12:01 PM

## 2020-11-24 NOTE — Consult Note (Signed)
Reason for Consult: Chest pain Referring Physician: [facility] hospitalist  Faith [name] is an 55 y.o. female.  HPI: Patient is 55 year old female with past medical history significant for hypertension, type 2 diabetes mellitus, hyperlipidemia, was admitted yesterday because of recurrent retrosternal sharp chest pain which started on Tuesday while at work states again had chest pain described as pressure radiating to left arm lasting few minutes resolved on its own denies any nausea vomiting diaphoresis.  Denies palpitation lightheadedness or syncope.  Denies history of exertional chest pain in the past.  Denies any cardiac work-up in the past.  EKG done in the ED showed normal sinus rhythm with poor R wave progression in anterior leads II sets of high-sensitivity troponin are negative.  Past Medical History:  Diagnosis Date   Diabetes mellitus    Hyperlipidemia    Hypertension    Morbid obesity (HCC)    Osteoarthritis, chronic    bilateral knees    Past Surgical History:  Procedure Laterality Date   CHOLECYSTECTOMY     GASTRIC BYPASS     TOTAL ABDOMINAL HYSTERECTOMY     TOTAL KNEE ARTHROPLASTY     TUBAL LIGATION      Family History  Problem Relation Age of Onset   Hypertension Father    Anuerysm Mother 40       mother died in 79 at age 66 from brain aneurysm   Other Other        estranged from father    Social History:  reports that she has never smoked. She has never used smokeless tobacco. She reports that she does not drink alcohol and does not use drugs.  Allergies:  Allergies  Allergen Reactions   Percocet [Oxycodone-Acetaminophen] Anaphylaxis   Propoxyphene Swelling   Nsaids     Other reaction(s): Other (See Comments) Cannot take due to gastric bypass Cannot take due to gastric bypass     Medications: I have reviewed the patient's current medications.  Results for orders placed or performed during the hospital encounter of 11/23/20 (from the past 48 hour(s))   Basic metabolic panel     Status: Abnormal   Collection Time: 11/23/20  4:18 PM  Result Value Ref Range   Sodium 138 135 - 145 mmol/L   Potassium 4.5 3.5 - 5.1 mmol/L   Chloride 105 98 - 111 mmol/L   CO2 26 22 - 32 mmol/L   Glucose, Bld 86 70 - 99 mg/dL    Comment: Glucose reference range applies only to samples taken after fasting for at least 8 hours.   BUN 18 6 - 20 mg/dL   Creatinine, Ser 1.77 (H) 0.44 - 1.00 mg/dL   Calcium 9.2 8.9 - 93.9 mg/dL   GFR, Estimated 60 (L) >60 mL/min    Comment: (NOTE) Calculated using the CKD-EPI Creatinine Equation (2021)    Anion gap 7 5 - 15    Comment: Performed at [facility], [address] [place], [place] [address]  CBC     Status: Abnormal   Collection Time: 11/23/20  4:18 PM  Result Value Ref Range   WBC 5.4 4.0 - 10.5 K/uL   RBC 3.77 (L) 3.87 - 5.11 MIL/uL   Hemoglobin 11.4 (L) 12.0 - 15.0 g/dL   HCT 23.3 (L) 00.7 - 62.2 %   MCV 91.0 80.0 - 100.0 fL   MCH 30.2 26.0 - 34.0 pg   MCHC 33.2 30.0 - 36.0 g/dL   RDW 63.3 35.4 - 56.2 %  Platelets 335 150 - 400 K/uL   nRBC 0.0 0.0 - 0.2 %    Comment: Performed at [facility], [address] [place], [place] [address]  Troponin I (High Sensitivity)     Status: None   Collection Time: 11/23/20  4:18 PM  Result Value Ref Range   Troponin I (High Sensitivity) 4 <18 ng/L    Comment: (NOTE) Elevated high sensitivity troponin I (hsTnI) values and significant  changes across serial measurements may suggest ACS but many other  chronic and acute conditions are known to elevate hsTnI results.  Refer to the "Links" section for chest pain algorithms and additional  guidance. Performed at [facility], [address] [place], [place] [address]   Resp Panel by RT-PCR (Flu A&B, Covid) Nasopharyngeal Swab     Status: None   Collection Time: 11/23/20  4:40 PM   Specimen: Nasopharyngeal Swab; Nasopharyngeal(NP) swabs in vial transport medium  Result Value Ref  Range   SARS Coronavirus 2 by RT PCR NEGATIVE NEGATIVE    Comment: (NOTE) SARS-CoV-2 target nucleic acids are NOT DETECTED.  The SARS-CoV-2 RNA is generally detectable in upper respiratory specimens during the acute phase of infection. The lowest concentration of SARS-CoV-2 viral copies this assay can detect is 138 copies/mL. A negative result does not preclude SARS-Cov-2 infection and should not be used as the sole basis for treatment or other patient management decisions. A negative result may occur with  improper specimen collection/handling, submission of specimen other than nasopharyngeal swab, presence of viral mutation(s) within the areas targeted by this assay, and inadequate number of viral copies(<138 copies/mL). A negative result must be combined with clinical observations, patient history, and epidemiological information. The expected result is Negative.  Fact Sheet for Patients:  BloggerCourse.com  Fact Sheet for Healthcare Providers:  SeriousBroker.it  This test is no t yet approved or cleared by the [place] FDA and  has been authorized for detection and/or diagnosis of SARS-CoV-2 by FDA under an Emergency Use Authorization (EUA). This EUA will remain  in effect (meaning this test can be used) for the duration of the COVID-19 declaration under Section 564(b)(1) of the Act, 21 U.S.C.section 360bbb-3(b)(1), unless the authorization is terminated  or revoked sooner.       Influenza A by PCR NEGATIVE NEGATIVE   Influenza B by PCR NEGATIVE NEGATIVE    Comment: (NOTE) The Xpert Xpress SARS-CoV-2/FLU/RSV plus assay is intended as an aid in the diagnosis of influenza from Nasopharyngeal swab specimens and should not be used as a sole basis for treatment. Nasal washings and aspirates are unacceptable for Xpert Xpress SARS-CoV-2/FLU/RSV testing.  Fact Sheet for  Patients: BloggerCourse.com  Fact Sheet for Healthcare Providers: SeriousBroker.it  This test is not yet approved or cleared by the [place] FDA and has been authorized for detection and/or diagnosis of SARS-CoV-2 by FDA under an Emergency Use Authorization (EUA). This EUA will remain in effect (meaning this test can be used) for the duration of the COVID-19 declaration under Section 564(b)(1) of the Act, 21 U.S.C. section 360bbb-3(b)(1), unless the authorization is terminated or revoked.  Performed at [facility], [address] [place], [place] [address]   Troponin I (High Sensitivity)     Status: None   Collection Time: 11/23/20  6:08 PM  Result Value Ref Range   Troponin I (High Sensitivity) 4 <18 ng/L    Comment: (NOTE) Elevated high sensitivity troponin I (hsTnI) values and  significant  changes across serial measurements may suggest ACS but many other  chronic and acute conditions are known to elevate hsTnI results.  Refer to the "Links" section for chest pain algorithms and additional  guidance. Performed at [facility], [address] [place], [place] [address]   Glucose, capillary     Status: None   Collection Time: 11/23/20 10:57 PM  Result Value Ref Range   Glucose-Capillary 99 70 - 99 mg/dL    Comment: Glucose reference range applies only to samples taken after fasting for at least 8 hours.  Heparin level (unfractionated)     Status: Abnormal   Collection Time: 11/24/20  2:56 AM  Result Value Ref Range   Heparin Unfractionated 0.25 (L) 0.30 - 0.70 IU/mL    Comment: (NOTE) The clinical reportable range upper limit is being lowered to >1.10 to align with the FDA approved guidance for the current laboratory assay.  If heparin results are below expected values, and patient dosage has  been confirmed, suggest follow up testing of antithrombin III levels. Performed at [facility], [address], [place], [place] [address]   CBC     Status: Abnormal   Collection Time: 11/24/20  2:56 AM  Result Value Ref Range   WBC 4.8 4.0 - 10.5 K/uL   RBC 3.55 (L) 3.87 - 5.11 MIL/uL   Hemoglobin 10.6 (L) 12.0 - 15.0 g/dL   HCT 09.8 (L) 11.9 - 14.7 %   MCV 90.4 80.0 - 100.0 fL   MCH 29.9 26.0 - 34.0 pg   MCHC 33.0 30.0 - 36.0 g/dL   RDW 82.9 56.2 - 13.0 %   Platelets 328 150 - 400 K/uL   nRBC 0.0 0.0 - 0.2 %    Comment: Performed at [facility], [address] [place], [place] [address]  HIV Antibody (routine testing w rflx)     Status: None   Collection Time: 11/24/20  2:56 AM  Result Value Ref Range   HIV Screen 4th Generation wRfx Non Reactive Non Reactive    Comment: Performed at [facility], [address] [place], [place] [address]  Basic metabolic panel     Status: None   Collection Time: 11/24/20  2:56 AM  Result Value Ref Range   Sodium 137 135 - 145 mmol/L   Potassium 3.8 3.5 - 5.1 mmol/L   Chloride 104 98 - 111 mmol/L   CO2 25 22 - 32 mmol/L   Glucose, Bld 85 70 - 99 mg/dL    Comment: Glucose reference range applies only to samples taken after fasting for at least 8 hours.   BUN 11 6 - 20 mg/dL   Creatinine, Ser 9.52 0.44 - 1.00 mg/dL   Calcium 9.1 8.9 - 84.1 mg/dL   GFR, Estimated >32 >44 mL/min    Comment: (NOTE) Calculated using the CKD-EPI Creatinine Equation (2021)    Anion gap 8 5 - 15    Comment: Performed at [facility], [address] [place], [place] [address]  Hemoglobin A1c     Status: None   Collection Time: 11/24/20  2:56 AM  Result Value Ref Range   Hgb A1c MFr Bld 5.3 4.8 - 5.6 %    Comment: (NOTE) Pre diabetes:          5.7%-6.4%  Diabetes:              >6.4%  Glycemic control for   <7.0%  adults with diabetes    Mean Plasma Glucose 105.41 mg/dL    Comment: Performed at [facility], [address], [place], [place] [address]  Troponin I (High Sensitivity)     Status: None   Collection  Time: 11/24/20  2:56 AM  Result Value Ref Range   Troponin I (High Sensitivity) 8 <18 ng/L    Comment: (NOTE) Elevated high sensitivity troponin I (hsTnI) values and significant  changes across serial measurements may suggest ACS but many other  chronic and acute conditions are known to elevate hsTnI results.  Refer to the "Links" section for chest pain algorithms and additional  guidance. Performed at [facility], [address], [place], [place] [address]   Heparin level (unfractionated)     Status: Abnormal   Collection Time: 11/24/20 10:39 AM  Result Value Ref Range   Heparin Unfractionated 0.24 (L) 0.30 - 0.70 IU/mL    Comment: (NOTE) The clinical reportable range upper limit is being lowered to >1.10 to align with the FDA approved guidance for the current laboratory assay.  If heparin results are below expected values, and patient dosage has  been confirmed, suggest follow up testing of antithrombin III levels. Performed at [facility], [address], [place], [place] [address]     DG Chest 2 View  Result Date: 11/23/2020 CLINICAL DATA:  Intermittent chest pain since yesterday EXAM: CHEST - 2 VIEW COMPARISON:  01/30/2011 FINDINGS: The heart size and mediastinal contours are within normal limits. Both lungs are clear. The visualized skeletal structures are unremarkable. IMPRESSION: No active cardiopulmonary disease. Electronically Signed   By: Sharlet Salina M.D.   On: 11/23/2020 17:08   CT Head Wo Contrast  Result Date: 11/23/2020 CLINICAL DATA:  Neuro deficit, stroke suspected EXAM: CT HEAD WITHOUT CONTRAST TECHNIQUE: Contiguous axial images were obtained from the base of the skull through the vertex without intravenous contrast. COMPARISON:  12/22/2016 FINDINGS: Brain: No evidence of acute infarction, hemorrhage, cerebral edema, mass, mass effect, or midline shift. Ventricles and sulci are within normal limits for age. No extra-axial fluid collection.  Scattered periventricular white matter changes, likely the sequela of chronic small vessel ischemic disease. Vascular: No hyperdense vessel or unexpected calcification. Skull: Normal. Negative for fracture or focal lesion. Sinuses/Orbits: No acute finding. Other: The mastoid air cells are well aerated. IMPRESSION: No acute intracranial process. Electronically Signed   By: Wiliam Ke M.D.   On: 11/23/2020 17:51   CT Angio Chest Aorta W and/or Wo Contrast  Result Date: 11/23/2020 CLINICAL DATA:  55 year old female with history of chest pain intermittently since yesterday. EXAM: CT ANGIOGRAPHY CHEST WITH CONTRAST TECHNIQUE: Multidetector CT imaging of the chest was performed using the standard protocol during bolus administration of intravenous contrast. Multiplanar CT image reconstructions and MIPs were obtained to evaluate the vascular anatomy. CONTRAST:  OMNIPAQUE IOHEXOL 350 MG/ML SOLN COMPARISON:  None. FINDINGS: Cardiovascular: Precontrast images demonstrate no definite crescentic high attenuation associated with the wall of the thoracic aorta to indicate acute intramural hemorrhage. No aneurysm or dissection of the thoracic aorta. Bovine type thoracic aortic arch (normal anatomical variant) incidentally noted. Atherosclerotic calcifications are noted in the descending thoracic aorta as well as the left anterior descending coronary artery. Mild calcifications of the aortic valve. Heart size is borderline enlarged. There is no significant pericardial fluid, thickening or pericardial calcification. Mediastinum/Nodes: No pathologically enlarged mediastinal or hilar lymph nodes. Moderate-sized hiatal hernia. No axillary lymphadenopathy. Lungs/Pleura: No suspicious appearing pulmonary nodules  or masses are noted. No acute consolidative airspace disease. No pleural effusions. 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 Status post cholecystectomy. Intra  and extrahepatic biliary ductal dilatation, similar to prior CT the abdomen and pelvis 12/23/2014, likely reflective of benign post cholecystectomy physiology. 1.3 cm left adrenal nodule (axial image 86 of series 7). Musculoskeletal: There are no aggressive appearing lytic or blastic lesions noted in the visualized portions of the skeleton. Review of the MIP images confirms the above findings. IMPRESSION: 1. No acute findings are noted to account for the patient's symptoms. 2. Moderate-sized hiatal hernia. 3. Aortic atherosclerosis, in addition to left anterior descending coronary artery disease. Please note that although the presence of coronary artery calcium documents the presence of coronary artery disease, the severity of this disease and any potential stenosis cannot be assessed on this non-gated CT examination. Assessment for potential risk factor modification, dietary therapy or pharmacologic therapy may be warranted, if clinically indicated. 4. There are calcifications of the aortic valve. Echocardiographic correlation for evaluation of potential valvular dysfunction may be warranted if clinically indicated. 5. Small left adrenal nodule measuring 1.3 cm. Statistically, this is likely to represent a benign lesions such as a small adenoma. Follow-up adrenal protocol CT scan could be considered in 12 months to ensure the stability of this lesion and provide more definitive characterization. This recommendation follows ACR consensus guidelines: Management of Incidental Adrenal Masses: A White Paper of the ACR Incidental Findings Committee. J Am Coll Radiol 2017;14:1038-1044. Aortic Atherosclerosis (ICD10-I70.0). Electronically Signed   By: Trudie Reed M.D.   On: 11/23/2020 18:04    Review of Systems  Constitutional:  Negative for diaphoresis.  HENT:  Negative for sore throat.   Eyes:  Negative for discharge.  Respiratory:  Negative for cough and shortness of breath.   Cardiovascular:  Positive for  chest pain. Negative for palpitations and leg swelling.  Gastrointestinal:  Negative for abdominal distention and abdominal pain.  Genitourinary:  Negative for difficulty urinating and flank pain.  Neurological:  Negative for dizziness and seizures.  Blood pressure (!) 160/97, pulse 66, temperature 98.7 F (37.1 C), temperature source Oral, resp. rate 19, height 5' (1.524 m), weight 95.7 kg, SpO2 97 %. Physical Exam Constitutional:      Appearance: She is well-developed.  HENT:     Head: Normocephalic and atraumatic.  Eyes:     Extraocular Movements: Extraocular movements intact.     Pupils: Pupils are equal, round, and reactive to light.  Cardiovascular:     Rate and Rhythm: Normal rate and regular rhythm.     Heart sounds: Normal heart sounds.    No S3 or S4 sounds.  Pulmonary:     Effort: Pulmonary effort is normal.     Breath sounds: Normal breath sounds.  Abdominal:     General: Bowel sounds are normal.     Palpations: Abdomen is soft. There is no hepatomegaly.  Musculoskeletal:        General: Normal range of motion.     Right lower leg: No tenderness. No edema.     Left lower leg: No tenderness. No edema.  Skin:    General: Skin is warm and dry.     Coloration: Skin is not cyanotic.     Nails: There is no clubbing.  Neurological:     General: No focal deficit present.     Mental Status: She is alert. She is disoriented.    Assessment/Plan: Noncardiac chest pain  with some features worrisome for angina MI ruled out Hypertension Type 2 diabetes mellitus Hyperlipidemia Morbid obesity Degenerative joint disease Hiatus hernia Plan Continue present management We will schedule for nuclear stress test in a.m. Check lipid panel   Rinaldo Cloud 11/24/2020, 11:54 AM

## 2020-11-24 NOTE — Plan of Care (Signed)
  Problem: Health Behavior/Discharge Planning: Goal: Ability to manage health-related needs will improve Outcome: Progressing   Problem: Clinical Measurements: Goal: Ability to maintain clinical measurements within normal limits will improve Outcome: Progressing   Problem: Clinical Measurements: Goal: Diagnostic test results will improve Outcome: Progressing   Problem: Clinical Measurements: Goal: Cardiovascular complication will be avoided Outcome: Progressing   Problem: Pain Managment: Goal: General experience of comfort will improve Outcome: Progressing   Problem: Safety: Goal: Ability to remain free from injury will improve Outcome: Progressing

## 2020-11-24 NOTE — Plan of Care (Signed)
  Problem: Education: Goal: Knowledge of General Education information will improve Description: Including pain rating scale, medication(s)/side effects and non-pharmacologic comfort measures Outcome: Progressing   Problem: Health Behavior/Discharge Planning: Goal: Ability to manage health-related needs will improve Outcome: Progressing   Problem: Clinical Measurements: Goal: Ability to maintain clinical measurements within normal limits will improve Outcome: Progressing   Problem: Clinical Measurements: Goal: Diagnostic test results will improve Outcome: Progressing   Problem: Clinical Measurements: Goal: Cardiovascular complication will be avoided Outcome: Progressing   Problem: Pain Managment: Goal: General experience of comfort will improve Outcome: Progressing   Problem: Safety: Goal: Ability to remain free from injury will improve Outcome: Progressing   

## 2020-11-25 ENCOUNTER — Observation Stay (HOSPITAL_COMMUNITY): Payer: BC Managed Care – PPO

## 2020-11-25 DIAGNOSIS — K449 Diaphragmatic hernia without obstruction or gangrene: Secondary | ICD-10-CM | POA: Diagnosis not present

## 2020-11-25 DIAGNOSIS — R079 Chest pain, unspecified: Secondary | ICD-10-CM | POA: Diagnosis not present

## 2020-11-25 LAB — LIPID PANEL
Cholesterol: 233 mg/dL — ABNORMAL HIGH (ref 0–200)
HDL: 110 mg/dL (ref >40)
LDL Cholesterol: 110 mg/dL — ABNORMAL HIGH (ref 0–99)
Total CHOL/HDL Ratio: 2.1 RATIO
Triglycerides: 63 mg/dL (ref <150)
VLDL: 13 mg/dL (ref 0–40)

## 2020-11-25 LAB — CBC
HCT: 35.2 % — ABNORMAL LOW (ref 36.0–46.0)
Hemoglobin: 11.9 g/dL — ABNORMAL LOW (ref 12.0–15.0)
MCH: 30.1 pg (ref 26.0–34.0)
MCHC: 33.8 g/dL (ref 30.0–36.0)
MCV: 89.1 fL (ref 80.0–100.0)
Platelets: 343 10*3/uL (ref 150–400)
RBC: 3.95 MIL/uL (ref 3.87–5.11)
RDW: 12.7 % (ref 11.5–15.5)
WBC: 4.8 10*3/uL (ref 4.0–10.5)
nRBC: 0 % (ref 0.0–0.2)

## 2020-11-25 LAB — HEPARIN LEVEL (UNFRACTIONATED)
Heparin Unfractionated: 0.54 IU/mL (ref 0.30–0.70)
Heparin Unfractionated: 0.61 IU/mL (ref 0.30–0.70)

## 2020-11-25 MED ORDER — TECHNETIUM TC 99M TETROFOSMIN IV KIT
33.0000 | PACK | Freq: Once | INTRAVENOUS | Status: AC | PRN
  Administered 2020-11-25: 33 via INTRAVENOUS

## 2020-11-25 MED ORDER — NITROGLYCERIN 0.4 MG SL SUBL: 0.4000 mg | SUBLINGUAL_TABLET | SUBLINGUAL | 0 refills | Status: AC | PRN

## 2020-11-25 MED ORDER — ATORVASTATIN CALCIUM 40 MG PO TABS: 40.0000 mg | ORAL_TABLET | Freq: Every day | ORAL | 0 refills | Status: AC

## 2020-11-25 MED ORDER — ATORVASTATIN CALCIUM 40 MG PO TABS
40.0000 mg | ORAL_TABLET | Freq: Every day | ORAL | Status: DC
  Administered 2020-11-25: 40 mg via ORAL
  Filled 2020-11-25: qty 1

## 2020-11-25 MED ORDER — ASPIRIN 81 MG PO TBEC: 81.0000 mg | DELAYED_RELEASE_TABLET | Freq: Every day | ORAL | 0 refills | Status: AC

## 2020-11-25 MED ORDER — REGADENOSON 0.4 MG/5ML IV SOLN: 0.4000 mg | Freq: Once | INTRAVENOUS | Status: AC

## 2020-11-25 MED ORDER — LOSARTAN POTASSIUM 50 MG PO TABS: 50.0000 mg | ORAL_TABLET | Freq: Every day | ORAL | Status: DC

## 2020-11-25 MED ORDER — LOSARTAN POTASSIUM 50 MG PO TABS: 50.0000 mg | ORAL_TABLET | Freq: Every day | ORAL | 0 refills | Status: AC

## 2020-11-25 MED ORDER — REGADENOSON 0.4 MG/5ML IV SOLN
INTRAVENOUS | Status: AC
  Administered 2020-11-25: 0.4 mg via INTRAVENOUS
  Filled 2020-11-25: qty 5

## 2020-11-25 MED ORDER — TECHNETIUM TC 99M TETROFOSMIN IV KIT
11.0000 | PACK | Freq: Once | INTRAVENOUS | Status: AC | PRN
  Administered 2020-11-25: 11 via INTRAVENOUS

## 2020-11-25 NOTE — Progress Notes (Signed)
ANTICOAGULATION CONSULT NOTE  Pharmacy Consult for heparin Indication: chest pain/ACS  Allergies  Allergen Reactions   Percocet [Oxycodone-Acetaminophen] Anaphylaxis   Propoxyphene Swelling   Nsaids     Other reaction(s): Other (See Comments) Cannot take due to gastric bypass Cannot take due to gastric bypass     Patient Measurements: Height: 5' (152.4 cm) Weight: 95.7 kg (211 lb) IBW/kg (Calculated) : 45.5 Heparin Dosing Weight: 68.5kg  Vital Signs: Temp: 98.6 F (37 C) (10/14 1352) Temp Source: Oral (10/14 1352) BP: 139/77 (10/14 1352) Pulse Rate: 89 (10/14 1352)  Labs: Recent Labs    11/23/20 1618 11/23/20 1808 11/24/20 0256 11/24/20 1039 11/24/20 1902 11/25/20 0447 11/25/20 1141  HGB 11.4*  --  10.6*  --   --  11.9*  --   HCT 34.3*  --  32.1*  --   --  35.2*  --   PLT 335  --  328  --   --  343  --   HEPARINUNFRC  --   --  0.25*   < > <0.10* 0.54 0.61  CREATININE 1.08*  --  0.94  --   --   --   --   TROPONINIHS 4 4 8   --   --   --   --    < > = values in this interval not displayed.     Estimated Creatinine Clearance: 68.4 mL/min (by C-G formula based on SCr of 0.94 mg/dL).   Medical History: Past Medical History:  Diagnosis Date   Diabetes mellitus    Hyperlipidemia    Hypertension    Morbid obesity (HCC)    Osteoarthritis, chronic    bilateral knees    Assessment: 39 YOF presenting with CP, hx of HLD, HTN, DM.  She is not on anticoagulation PTA, chronic anemia stable. He is s/p nuclear stress test  Heparin level therapeutic (0.63) on gtt at 1150 units/hr.  Goal of Therapy:  Heparin level 0.3-0.7 units/ml Monitor platelets by anticoagulation protocol: Yes   Plan:  Continue heparin at 1150 units/hr Daily heparin level and CBC  56, PharmD Clinical Pharmacist **Pharmacist phone directory can now be found on [email or web address] ([email or web address]).  Listed under [facility].

## 2020-11-25 NOTE — Discharge Summary (Signed)
Discharge Summary  Faith [name] ZOX:[id number] DOB: 1965/03/20  PCP: Karle Plumber, MD  Admit date: 11/23/2020 Discharge date: 11/25/2020  Time spent:   Recommendations for Outpatient Follow-up:  F/u with PCP within a week  for hospital discharge follow up, repeat cbc/bmp at follow up, PCP to refer patient to GI for moderate size hiatal hernia Follow-up with cardiology Dr. Sharyn Lull in 2 weeks    Discharge Diagnoses:  Active Hospital Problems   Diagnosis Date Noted   Chest pain 11/23/2020   Adrenal nodule (HCC) 11/23/2020   Essential hypertension 05/26/2010   Hyperlipidemia 03/28/2009    Resolved Hospital Problems  No resolved problems to display.    Discharge Condition: stable  Diet recommendation: heart healthy  Filed Weights   11/23/20 1606  Weight: 95.7 kg    History of present illness: (Per admitting MD Dr. Allena Katz) Chief Complaint: Chest pain   HPI: Faith [name] is a 55 y.o. female with medical history significant for type 2 diabetes, hypertension, hyperlipidemia who presented to the ED for evaluation of chest pain.  Patient reports new midsternal chest pain beginning 11/22/2020 around afternoon.  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  At the time she did not have any other associated symptoms.  Symptoms lasted about 5 minutes before resolving on their own.  She had another episode on 10/12, this time chest discomfort was described as a pressure-like sensation.  This time she developed diaphoresis and felt fatigued.  Afterwards she felt lightheaded and felt that she was falling to her left side.  She also noted some slurred speech.  The symptoms were occurring while her chest discomfort was ongoing.  Symptoms resolved on their own and she has been chest pain-free since.  She denies any similar chest pain in the past or history of heart disease.  She denies any  associated nausea, vomiting, dyspnea, cough, abdominal pain, dysuria, peripheral edema, focal weakness.  She reports intermittent numbness and tingling in both of her hands.  She states the only medication she is currently taking is amlodipine.  She denies any history of tobacco, alcohol, illicit drug use.  She denies any known family history of heart disease in her immediate family.   [facility] ED Course:  Initial vitals show BP 157/87, pulse 62, RR 11, temp 98.5 F, SPO2 100% on room air.   Labs show WBC 5.4, hemoglobin 11.4, platelets 335,000, sodium 138, potassium 4.5, bicarb 26, BUN 18, creatinine 1.08, serum glucose 86, high-sensitivity troponin 4x2.   2 view chest x-ray negative for focal consolidation, edema, or effusion.   CT head without contrast is negative for acute intracranial process.   CTA chest with contrast is negative for acute findings to account for patient's current symptoms.  Moderate-sized hiatal hernia noted.  Aortic atherosclerosis as well left anterior descending coronary artery disease noted.  Calcifications of the aortic valve seen.  Small left adrenal nodule measuring 1.3 cm also reported.   Patient was given aspirin 324 mg and started on IV heparin. The hospitalist service was consulted to admit for further evaluation and management.    Hospital Course:  Principal Problem:   Chest pain Active Problems:   Hyperlipidemia   Essential hypertension   Adrenal nodule (HCC)   Chest pain -CTA chest with contrast is negative for acute findings to account for patient's current symptoms.  Moderate-sized hiatal hernia noted.  Aortic atherosclerosis  as well left anterior descending coronary artery disease noted.  Calcifications of the aortic valve seen.  Small left adrenal nodule measuring 1.3 cm also reported. Troponin negative Cardiology consulted ,underwent stress test which showed low risk Patient report chest pain has resolved, she desires to go  home Cardiology Dr. Sharyn Lull recommended discharge patient on aspirin  atorvastatin  losartan  nitroGLYCERIN and outpatient follow-up with him in 2 weeks   Hyperlipidemia Start statin   HTN Continue norvasc, was on lisinopril at home, Dr. Sharyn Lull recommended losartan at discharge, lisinopril discontinued  Moderate size hiatal hernia PCP to refer to GI    Small left adrenal nodule measuring 1.3 cm  F/u with pcp   Class III obesity: Body mass index is 41.21 kg/m.Marland Kitchen History of gastric bypass surgery done at [facility] She is interested in follow-up with local weight management clinic, information provided  Procedures: Stress test  Consultations: Cardiology Dr. Sharyn Lull  Discharge Exam: BP 139/77 (BP Location: Right Arm)   Pulse 89   Temp 98.6 F (37 C) (Oral)   Resp 18   Ht 5' (1.524 m)   Wt 95.7 kg   SpO2 97%   BMI 41.21 kg/m   General: NAD, pleasant Cardiovascular: RRR Respiratory: Normal respiratory effort  Discharge Instructions You were cared for by a hospitalist during your hospital stay. If you have any questions about your discharge medications or the care you received while you were in the hospital after you are discharged, you can call the unit and asked to speak with the hospitalist on call if the hospitalist that took care of you is not available. Once you are discharged, your primary care physician will handle any further medical issues. Please note that NO REFILLS for any discharge medications will be authorized once you are discharged, as it is imperative that you return to your primary care physician (or establish a relationship with a primary care physician if you do not have one) for your aftercare needs so that they can reassess your need for medications and monitor your lab values.  Discharge Instructions     Diet - low sodium heart healthy   Complete by: As directed    Increase activity slowly   Complete by: As directed       Allergies as of  11/25/2020       Reactions   Percocet [oxycodone-acetaminophen] Anaphylaxis   Propoxyphene Swelling   Nsaids    Other reaction(s): Other (See Comments) Cannot take due to gastric bypass Cannot take due to gastric bypass        Medication List     STOP taking these medications    lisinopril 20 MG tablet Commonly known as: ZESTRIL       TAKE these medications    amLODipine 10 MG tablet Commonly known as: NORVASC Take 10 mg by mouth daily.   aspirin 81 MG EC tablet Take 1 tablet (81 mg total) by mouth daily. Swallow whole. Start taking on: November 26, 2020   atorvastatin 40 MG tablet Commonly known as: LIPITOR Take 1 tablet (40 mg total) by mouth daily.   [employer] Misc 1 Device by Does not apply route once.   losartan 50 MG tablet Commonly known as: COZAAR Take 1 tablet (50 mg total) by mouth daily.   multivitamin tablet Take 1 tablet by mouth daily.   nitroGLYCERIN 0.4 MG SL tablet Commonly known as: NITROSTAT Place 1 tablet (0.4 mg total) under the tongue every 5 (five) minutes as needed  for chest pain.   Vitamin D (Ergocalciferol) 1.25 MG (50000 UNIT) Caps capsule Commonly known as: DRISDOL Take 50,000 Units by mouth once a week. Monday's       Allergies  Allergen Reactions   Percocet [Oxycodone-Acetaminophen] Anaphylaxis   Propoxyphene Swelling   Nsaids     Other reaction(s): Other (See Comments) Cannot take due to gastric bypass Cannot take due to gastric bypass     Follow-up Information     Karle Plumber, MD Follow up in 1 week(s).   Specialty: Internal Medicine Why: hospital discharge follow up, pcp to refer you to GI for Moderate-sized hiatal hernia. Contact information: [address] [place] [address] [phone]         Rinaldo Cloud, MD Follow up in 2 week(s).   Specialty: Cardiology Contact information: [address] [place] [address] [phone]         [facility] Follow up.   Contact information: [address] [place] [id number]                 The results of significant diagnostics from this hospitalization (including imaging, microbiology, ancillary and laboratory) are listed below for reference.    Significant Diagnostic Studies: DG Chest 2 View  Result Date: 11/23/2020 CLINICAL DATA:  Intermittent chest pain since yesterday EXAM: CHEST - 2 VIEW COMPARISON:  01/30/2011 FINDINGS: The heart size and mediastinal contours are within normal limits. Both lungs are clear. The visualized skeletal structures are unremarkable. IMPRESSION: No active cardiopulmonary disease. Electronically Signed   By: Sharlet Salina M.D.   On: 11/23/2020 17:08   CT Head Wo Contrast  Result Date: 11/23/2020 CLINICAL DATA:  Neuro deficit, stroke suspected EXAM: CT HEAD WITHOUT CONTRAST TECHNIQUE: Contiguous axial images were obtained from the base of the skull through the vertex without intravenous contrast. COMPARISON:  12/22/2016 FINDINGS: Brain: No evidence of acute infarction, hemorrhage, cerebral edema, mass, mass effect, or midline shift. Ventricles and sulci are within normal limits for age. No extra-axial fluid collection. Scattered periventricular white matter changes, likely the sequela of chronic small vessel ischemic disease. Vascular: No hyperdense vessel or unexpected calcification. Skull: Normal. Negative for fracture or focal lesion. Sinuses/Orbits: No acute finding. Other: The mastoid air cells are well aerated. IMPRESSION: No acute intracranial process. Electronically Signed   By: Wiliam Ke M.D.   On: 11/23/2020 17:51   NM Myocar Multi W/Spect W/Wall Motion / EF  Result Date: 11/25/2020 CLINICAL DATA:  55 year old female with a history chest pain EXAM: MYOCARDIAL IMAGING WITH SPECT (REST AND PHARMACOLOGIC-STRESS) GATED LEFT VENTRICULAR WALL MOTION STUDY LEFT VENTRICULAR EJECTION FRACTION TECHNIQUE: Standard  myocardial SPECT imaging was performed after resting intravenous injection of 11 mCi Tc-53m tetrofosmin. Subsequently, intravenous infusion of Lexiscan was performed under the supervision of the Cardiology staff. At peak effect of the drug, 33 mCi Tc-50m tetrofosmin was injected intravenously and standard myocardial SPECT imaging was performed. Quantitative gated imaging was also performed to evaluate left ventricular wall motion, and estimate left ventricular ejection fraction. COMPARISON:  None. FINDINGS: Perfusion: No decreased activity in the left ventricle on stress imaging to suggest reversible ischemia or infarction. Wall Motion: Normal left ventricular wall motion. No left ventricular dilation. Left Ventricular Ejection Fraction: 64 % End diastolic volume 78 ml End systolic volume 28 ml IMPRESSION: 1. No reversible ischemia or infarction. 2. Normal left ventricular wall motion. 3. Left ventricular ejection fraction 64% 4. Non invasive risk stratification*: Low *  2012 Appropriate Use Criteria for Coronary Revascularization Focused Update: J Am Coll Cardiol. 2012;59(9):857-881. http://content.dementiazones.com.aspx?articleid=[email or web address] Electronically Signed   By: Gilmer Mor D.O.   On: 11/25/2020 14:28   CT Angio Chest Aorta W and/or Wo Contrast  Result Date: 11/23/2020 CLINICAL DATA:  55 year old female with history of chest pain intermittently since yesterday. EXAM: CT ANGIOGRAPHY CHEST WITH CONTRAST TECHNIQUE: Multidetector CT imaging of the chest was performed using the standard protocol during bolus administration of intravenous contrast. Multiplanar CT image reconstructions and MIPs were obtained to evaluate the vascular anatomy. CONTRAST:  OMNIPAQUE IOHEXOL 350 MG/ML SOLN COMPARISON:  None. FINDINGS: Cardiovascular: Precontrast images demonstrate no definite crescentic high attenuation associated with the wall of the thoracic aorta to indicate acute intramural hemorrhage. No aneurysm or  dissection of the thoracic aorta. Bovine type thoracic aortic arch (normal anatomical variant) incidentally noted. Atherosclerotic calcifications are noted in the descending thoracic aorta as well as the left anterior descending coronary artery. Mild calcifications of the aortic valve. Heart size is borderline enlarged. There is no significant pericardial fluid, thickening or pericardial calcification. Mediastinum/Nodes: No pathologically enlarged mediastinal or hilar lymph nodes. Moderate-sized hiatal hernia. No axillary lymphadenopathy. Lungs/Pleura: No suspicious appearing pulmonary nodules or masses are noted. No acute consolidative airspace disease. No pleural effusions. 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 Status post cholecystectomy. Intra and extrahepatic biliary ductal dilatation, similar to prior CT the abdomen and pelvis 12/23/2014, likely reflective of benign post cholecystectomy physiology. 1.3 cm left adrenal nodule (axial image 86 of series 7). Musculoskeletal: There are no aggressive appearing lytic or blastic lesions noted in the visualized portions of the skeleton. Review of the MIP images confirms the above findings. IMPRESSION: 1. No acute findings are noted to account for the patient's symptoms. 2. Moderate-sized hiatal hernia. 3. Aortic atherosclerosis, in addition to left anterior descending coronary artery disease. Please note that although the presence of coronary artery calcium documents the presence of coronary artery disease, the severity of this disease and any potential stenosis cannot be assessed on this non-gated CT examination. Assessment for potential risk factor modification, dietary therapy or pharmacologic therapy may be warranted, if clinically indicated. 4. There are calcifications of the aortic valve. Echocardiographic correlation for evaluation of potential valvular dysfunction may be warranted if clinically indicated.  5. Small left adrenal nodule measuring 1.3 cm. Statistically, this is likely to represent a benign lesions such as a small adenoma. Follow-up adrenal protocol CT scan could be considered in 12 months to ensure the stability of this lesion and provide more definitive characterization. This recommendation follows ACR consensus guidelines: Management of Incidental Adrenal Masses: A White Paper of the ACR Incidental Findings Committee. J Am Coll Radiol 2017;14:1038-1044. Aortic Atherosclerosis (ICD10-I70.0). Electronically Signed   By: Trudie Reed M.D.   On: 11/23/2020 18:04   ECHOCARDIOGRAM COMPLETE  Result Date: 11/24/2020    ECHOCARDIOGRAM REPORT   Patient Name:   SHARIFA BUCHOLZ Date of Exam: 11/24/2020 Medical Rec #:  [id number]     Height:       60.0 in Accession #:    [id number]    Weight:       211.0 lb Date of Birth:  Jul 04, 1965      BSA:          1.910 m Patient Age:    55 years      BP:           160/97 mmHg Patient Gender: F  HR:           63 bpm. Exam Location:  Inpatient Procedure: 2D Echo Indications:    chest pain  History:        Patient has no prior history of Echocardiogram examinations.                 Risk Factors:Hypertension, Dyslipidemia and Diabetes.  Sonographer:    Delcie Roch RDCS Referring Phys: [id number] VISHAL R PATEL IMPRESSIONS  1. Left ventricular ejection fraction, by estimation, is 60 to 65%. The left ventricle has normal function. The left ventricle has no regional wall motion abnormalities. There is mild left ventricular hypertrophy. Left ventricular diastolic parameters are consistent with Grade I diastolic dysfunction (impaired relaxation).  2. Right ventricular systolic function is normal. The right ventricular size is normal.  3. Left atrial size was mildly dilated.  4. The mitral valve is normal in structure. Trivial mitral valve regurgitation.  5. The aortic valve is normal in structure. Aortic valve regurgitation is not visualized. Mild aortic valve  sclerosis is present, with no evidence of aortic valve stenosis.  6. The inferior vena cava is normal in size with greater than 50% respiratory variability, suggesting right atrial pressure of 3 mmHg. FINDINGS  Left Ventricle: Left ventricular ejection fraction, by estimation, is 60 to 65%. The left ventricle has normal function. The left ventricle has no regional wall motion abnormalities. The left ventricular internal cavity size was normal in size. There is  mild left ventricular hypertrophy. Left ventricular diastolic parameters are consistent with Grade I diastolic dysfunction (impaired relaxation). Right Ventricle: The right ventricular size is normal. No increase in right ventricular wall thickness. Right ventricular systolic function is normal. Left Atrium: Left atrial size was mildly dilated. Right Atrium: Right atrial size was normal in size. Pericardium: There is no evidence of pericardial effusion. Mitral Valve: The mitral valve is normal in structure. Trivial mitral valve regurgitation. Tricuspid Valve: The tricuspid valve is normal in structure. Tricuspid valve regurgitation is mild. Aortic Valve: The aortic valve is normal in structure. Aortic valve regurgitation is not visualized. Mild aortic valve sclerosis is present, with no evidence of aortic valve stenosis. Pulmonic Valve: The pulmonic valve was normal in structure. Pulmonic valve regurgitation is not visualized. Aorta: The aortic root is normal in size and structure. Venous: The inferior vena cava is normal in size with greater than 50% respiratory variability, suggesting right atrial pressure of 3 mmHg. IAS/Shunts: No atrial level shunt detected by color flow Doppler.  LEFT VENTRICLE PLAX 2D LVIDd:         4.90 cm   Diastology LVIDs:         2.60 cm   LV e' medial:    5.33 cm/s LV PW:         1.10 cm   LV E/e' medial:  14.2 LV IVS:        1.20 cm   LV e' lateral:   5.98 cm/s LVOT diam:     1.80 cm   LV E/e' lateral: 12.7 LV SV:         51 LV  SV Index:   27 LVOT Area:     2.54 cm  RIGHT VENTRICLE             IVC RV S prime:     11.90 cm/s  IVC diam: 1.00 cm TAPSE (M-mode): 3.4 cm LEFT ATRIUM             Index  RIGHT ATRIUM           Index LA diam:        4.10 cm 2.15 cm/m   RA Area:     14.30 cm LA Vol (A2C):   39.0 ml 20.42 ml/m  RA Volume:   36.00 ml  18.85 ml/m LA Vol (A4C):   44.9 ml 23.51 ml/m LA Biplane Vol: 42.6 ml 22.31 ml/m  AORTIC VALVE LVOT Vmax:   92.70 cm/s LVOT Vmean:  62.200 cm/s LVOT VTI:    0.200 m  AORTA Ao Root diam: 2.80 cm Ao Asc diam:  3.60 cm MITRAL VALVE MV Area (PHT): 3.42 cm     SHUNTS MV Decel Time: 222 msec     Systemic VTI:  0.20 m MV E velocity: 75.80 cm/s   Systemic Diam: 1.80 cm MV A velocity: 100.00 cm/s MV E/A ratio:  0.76 Rinaldo Cloud MD Electronically signed by Rinaldo Cloud MD Signature Date/Time: 11/24/2020/12:49:35 PM    Final     Microbiology: Recent Results (from the past 240 hour(s))  Resp Panel by RT-PCR (Flu A&B, Covid) Nasopharyngeal Swab     Status: None   Collection Time: 11/23/20  4:40 PM   Specimen: Nasopharyngeal Swab; Nasopharyngeal(NP) swabs in vial transport medium  Result Value Ref Range Status   SARS Coronavirus 2 by RT PCR NEGATIVE NEGATIVE Final    Comment: (NOTE) SARS-CoV-2 target nucleic acids are NOT DETECTED.  The SARS-CoV-2 RNA is generally detectable in upper respiratory specimens during the acute phase of infection. The lowest concentration of SARS-CoV-2 viral copies this assay can detect is 138 copies/mL. A negative result does not preclude SARS-Cov-2 infection and should not be used as the sole basis for treatment or other patient management decisions. A negative result may occur with  improper specimen collection/handling, submission of specimen other than nasopharyngeal swab, presence of viral mutation(s) within the areas targeted by this assay, and inadequate number of viral copies(<138 copies/mL). A negative result must be combined with clinical  observations, patient history, and epidemiological information. The expected result is Negative.  Fact Sheet for Patients:  BloggerCourse.com  Fact Sheet for Healthcare Providers:  SeriousBroker.it  This test is no t yet approved or cleared by the [place] FDA and  has been authorized for detection and/or diagnosis of SARS-CoV-2 by FDA under an Emergency Use Authorization (EUA). This EUA will remain  in effect (meaning this test can be used) for the duration of the COVID-19 declaration under Section 564(b)(1) of the Act, 21 U.S.C.section 360bbb-3(b)(1), unless the authorization is terminated  or revoked sooner.       Influenza A by PCR NEGATIVE NEGATIVE Final   Influenza B by PCR NEGATIVE NEGATIVE Final    Comment: (NOTE) The Xpert Xpress SARS-CoV-2/FLU/RSV plus assay is intended as an aid in the diagnosis of influenza from Nasopharyngeal swab specimens and should not be used as a sole basis for treatment. Nasal washings and aspirates are unacceptable for Xpert Xpress SARS-CoV-2/FLU/RSV testing.  Fact Sheet for Patients: BloggerCourse.com  Fact Sheet for Healthcare Providers: SeriousBroker.it  This test is not yet approved or cleared by the [place] FDA and has been authorized for detection and/or diagnosis of SARS-CoV-2 by FDA under an Emergency Use Authorization (EUA). This EUA will remain in effect (meaning this test can be used) for the duration of the COVID-19 declaration under Section 564(b)(1) of the Act, 21 U.S.C. section 360bbb-3(b)(1), unless the authorization is terminated or revoked.  Performed at [facility], [address] [place], [place] [address]      Labs: Basic Metabolic Panel: Recent Labs  Lab 11/23/20 1618 11/24/20 0256  NA 138 137  K 4.5 3.8  CL 105 104  CO2 26 25  GLUCOSE 86 85  BUN 18 11  CREATININE 1.08* 0.94   CALCIUM 9.2 9.1   Liver Function Tests: No results for input(s): AST, ALT, ALKPHOS, BILITOT, PROT, ALBUMIN in the last 168 hours. No results for input(s): LIPASE, AMYLASE in the last 168 hours. No results for input(s): AMMONIA in the last 168 hours. CBC: Recent Labs  Lab 11/23/20 1618 11/24/20 0256 11/25/20 0447  WBC 5.4 4.8 4.8  HGB 11.4* 10.6* 11.9*  HCT 34.3* 32.1* 35.2*  MCV 91.0 90.4 89.1  PLT 335 328 343   Cardiac Enzymes: No results for input(s): CKTOTAL, CKMB, CKMBINDEX, TROPONINI in the last 168 hours. BNP: BNP (last 3 results) No results for input(s): BNP in the last 8760 hours.  ProBNP (last 3 results) No results for input(s): PROBNP in the last 8760 hours.  CBG: Recent Labs  Lab 11/23/20 2257  GLUCAP 99       Signed:  Albertine Grates MD, PhD, FACP  [facility] 11/25/2020, 4:11 PM

## 2020-11-25 NOTE — Progress Notes (Signed)
Subjective:  Patient seen in the nuclear medicine Department.  Denies any chest pain or shortness of breath. tolerated the stress portion of the lexiscan Myoview.  Objective:  Vital Signs in the last 24 hours: Temp:  [98.4 F (36.9 C)-98.8 F (37.1 C)] 98.6 F (37 C) (10/14 0455) Pulse Rate:  [63-85] 80 (10/14 1153) Resp:  [16-19] 19 (10/14 0455) BP: (134-191)/(74-112) 156/97 (10/14 1153) SpO2:  [96 %-100 %] 100 % (10/14 1153)  Intake/Output from previous day: 10/13 0701 - 10/14 0700 In: 557.4 [P.O.:360; I.V.:197.4] Out: 300 [Urine:300] Intake/Output from this shift: No intake/output data recorded.  Physical Exam: Exam unchanged  Lab Results: Recent Labs    11/24/20 0256 11/25/20 0447  WBC 4.8 4.8  HGB 10.6* 11.9*  PLT 328 343   Recent Labs    11/23/20 1618 11/24/20 0256  NA 138 137  K 4.5 3.8  CL 105 104  CO2 26 25  GLUCOSE 86 85  BUN 18 11  CREATININE 1.08* 0.94   No results for input(s): TROPONINI in the last 72 hours.  Invalid input(s): CK, MB Hepatic Function Panel No results for input(s): PROT, ALBUMIN, AST, ALT, ALKPHOS, BILITOT, BILIDIR, IBILI in the last 72 hours. Recent Labs    11/25/20 0447  CHOL 233*   No results for input(s): PROTIME in the last 72 hours.  Imaging: DG Chest 2 View  Result Date: 11/23/2020 CLINICAL DATA:  Intermittent chest pain since yesterday EXAM: CHEST - 2 VIEW COMPARISON:  01/30/2011 FINDINGS: The heart size and mediastinal contours are within normal limits. Both lungs are clear. The visualized skeletal structures are unremarkable. IMPRESSION: No active cardiopulmonary disease. Electronically Signed   By: Sharlet Salina M.D.   On: 11/23/2020 17:08   CT Head Wo Contrast  Result Date: 11/23/2020 CLINICAL DATA:  Neuro deficit, stroke suspected EXAM: CT HEAD WITHOUT CONTRAST TECHNIQUE: Contiguous axial images were obtained from the base of the skull through the vertex without intravenous contrast. COMPARISON:  12/22/2016  FINDINGS: Brain: No evidence of acute infarction, hemorrhage, cerebral edema, mass, mass effect, or midline shift. Ventricles and sulci are within normal limits for age. No extra-axial fluid collection. Scattered periventricular white matter changes, likely the sequela of chronic small vessel ischemic disease. Vascular: No hyperdense vessel or unexpected calcification. Skull: Normal. Negative for fracture or focal lesion. Sinuses/Orbits: No acute finding. Other: The mastoid air cells are well aerated. IMPRESSION: No acute intracranial process. Electronically Signed   By: Wiliam Ke M.D.   On: 11/23/2020 17:51   CT Angio Chest Aorta W and/or Wo Contrast  Result Date: 11/23/2020 CLINICAL DATA:  55 year old female with history of chest pain intermittently since yesterday. EXAM: CT ANGIOGRAPHY CHEST WITH CONTRAST TECHNIQUE: Multidetector CT imaging of the chest was performed using the standard protocol during bolus administration of intravenous contrast. Multiplanar CT image reconstructions and MIPs were obtained to evaluate the vascular anatomy. CONTRAST:  OMNIPAQUE IOHEXOL 350 MG/ML SOLN COMPARISON:  None. FINDINGS: Cardiovascular: Precontrast images demonstrate no definite crescentic high attenuation associated with the wall of the thoracic aorta to indicate acute intramural hemorrhage. No aneurysm or dissection of the thoracic aorta. Bovine type thoracic aortic arch (normal anatomical variant) incidentally noted. Atherosclerotic calcifications are noted in the descending thoracic aorta as well as the left anterior descending coronary artery. Mild calcifications of the aortic valve. Heart size is borderline enlarged. There is no significant pericardial fluid, thickening or pericardial calcification. Mediastinum/Nodes: No pathologically enlarged mediastinal or hilar lymph nodes. Moderate-sized hiatal hernia. No axillary lymphadenopathy.  Lungs/Pleura: No suspicious appearing pulmonary nodules or masses  are noted. No acute consolidative airspace disease. No pleural effusions. 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 Status post cholecystectomy. Intra and extrahepatic biliary ductal dilatation, similar to prior CT the abdomen and pelvis 12/23/2014, likely reflective of benign post cholecystectomy physiology. 1.3 cm left adrenal nodule (axial image 86 of series 7). Musculoskeletal: There are no aggressive appearing lytic or blastic lesions noted in the visualized portions of the skeleton. Review of the MIP images confirms the above findings. IMPRESSION: 1. No acute findings are noted to account for the patient's symptoms. 2. Moderate-sized hiatal hernia. 3. Aortic atherosclerosis, in addition to left anterior descending coronary artery disease. Please note that although the presence of coronary artery calcium documents the presence of coronary artery disease, the severity of this disease and any potential stenosis cannot be assessed on this non-gated CT examination. Assessment for potential risk factor modification, dietary therapy or pharmacologic therapy may be warranted, if clinically indicated. 4. There are calcifications of the aortic valve. Echocardiographic correlation for evaluation of potential valvular dysfunction may be warranted if clinically indicated. 5. Small left adrenal nodule measuring 1.3 cm. Statistically, this is likely to represent a benign lesions such as a small adenoma. Follow-up adrenal protocol CT scan could be considered in 12 months to ensure the stability of this lesion and provide more definitive characterization. This recommendation follows ACR consensus guidelines: Management of Incidental Adrenal Masses: A White Paper of the ACR Incidental Findings Committee. J Am Coll Radiol 2017;14:1038-1044. Aortic Atherosclerosis (ICD10-I70.0). Electronically Signed   By: Trudie Reed M.D.   On: 11/23/2020 18:04   ECHOCARDIOGRAM  COMPLETE  Result Date: 11/24/2020    ECHOCARDIOGRAM REPORT   Patient Name:   Faith [name] Date of Exam: 11/24/2020 Medical Rec #:  [id number]     Height:       60.0 in Accession #:    [id number]    Weight:       211.0 lb Date of Birth:  May 18, 1965      BSA:          1.910 m Patient Age:    55 years      BP:           160/97 mmHg Patient Gender: F             HR:           63 bpm. Exam Location:  Inpatient Procedure: 2D Echo Indications:    chest pain  History:        Patient has no prior history of Echocardiogram examinations.                 Risk Factors:Hypertension, Dyslipidemia and Diabetes.  Sonographer:    Delcie Roch RDCS Referring Phys: [id number] VISHAL R PATEL IMPRESSIONS  1. Left ventricular ejection fraction, by estimation, is 60 to 65%. The left ventricle has normal function. The left ventricle has no regional wall motion abnormalities. There is mild left ventricular hypertrophy. Left ventricular diastolic parameters are consistent with Grade I diastolic dysfunction (impaired relaxation).  2. Right ventricular systolic function is normal. The right ventricular size is normal.  3. Left atrial size was mildly dilated.  4. The mitral valve is normal in structure. Trivial mitral valve regurgitation.  5. The aortic valve is normal in structure. Aortic valve regurgitation is not visualized. Mild aortic valve sclerosis is present, with no evidence of aortic valve stenosis.  6. The  inferior vena cava is normal in size with greater than 50% respiratory variability, suggesting right atrial pressure of 3 mmHg. FINDINGS  Left Ventricle: Left ventricular ejection fraction, by estimation, is 60 to 65%. The left ventricle has normal function. The left ventricle has no regional wall motion abnormalities. The left ventricular internal cavity size was normal in size. There is  mild left ventricular hypertrophy. Left ventricular diastolic parameters are consistent with Grade I diastolic dysfunction (impaired  relaxation). Right Ventricle: The right ventricular size is normal. No increase in right ventricular wall thickness. Right ventricular systolic function is normal. Left Atrium: Left atrial size was mildly dilated. Right Atrium: Right atrial size was normal in size. Pericardium: There is no evidence of pericardial effusion. Mitral Valve: The mitral valve is normal in structure. Trivial mitral valve regurgitation. Tricuspid Valve: The tricuspid valve is normal in structure. Tricuspid valve regurgitation is mild. Aortic Valve: The aortic valve is normal in structure. Aortic valve regurgitation is not visualized. Mild aortic valve sclerosis is present, with no evidence of aortic valve stenosis. Pulmonic Valve: The pulmonic valve was normal in structure. Pulmonic valve regurgitation is not visualized. Aorta: The aortic root is normal in size and structure. Venous: The inferior vena cava is normal in size with greater than 50% respiratory variability, suggesting right atrial pressure of 3 mmHg. IAS/Shunts: No atrial level shunt detected by color flow Doppler.  LEFT VENTRICLE PLAX 2D LVIDd:         4.90 cm   Diastology LVIDs:         2.60 cm   LV e' medial:    5.33 cm/s LV PW:         1.10 cm   LV E/e' medial:  14.2 LV IVS:        1.20 cm   LV e' lateral:   5.98 cm/s LVOT diam:     1.80 cm   LV E/e' lateral: 12.7 LV SV:         51 LV SV Index:   27 LVOT Area:     2.54 cm  RIGHT VENTRICLE             IVC RV S prime:     11.90 cm/s  IVC diam: 1.00 cm TAPSE (M-mode): 3.4 cm LEFT ATRIUM             Index        RIGHT ATRIUM           Index LA diam:        4.10 cm 2.15 cm/m   RA Area:     14.30 cm LA Vol (A2C):   39.0 ml 20.42 ml/m  RA Volume:   36.00 ml  18.85 ml/m LA Vol (A4C):   44.9 ml 23.51 ml/m LA Biplane Vol: 42.6 ml 22.31 ml/m  AORTIC VALVE LVOT Vmax:   92.70 cm/s LVOT Vmean:  62.200 cm/s LVOT VTI:    0.200 m  AORTA Ao Root diam: 2.80 cm Ao Asc diam:  3.60 cm MITRAL VALVE MV Area (PHT): 3.42 cm     SHUNTS MV  Decel Time: 222 msec     Systemic VTI:  0.20 m MV E velocity: 75.80 cm/s   Systemic Diam: 1.80 cm MV A velocity: 100.00 cm/s MV E/A ratio:  0.76 Rinaldo Cloud MD Electronically signed by Rinaldo Cloud MD Signature Date/Time: 11/24/2020/12:49:35 PM    Final     Cardiac Studies:  Assessment/Plan:  Noncardiac chest pain with some features worrisome for angina  MI ruled out Hypertension Type 2 diabetes mellitus Hyperlipidemia Morbid obesity Degenerative joint disease Hiatus hernia Plan Start losartan and atorvastatin as per orders Okay to discharge from cardiac point of view.  If nuclear stress test is negative for reversible ischemia.   LOS: 0 days    Rinaldo Cloud 11/25/2020, 12:38 PM

## 2020-11-25 NOTE — Progress Notes (Signed)
ANTICOAGULATION CONSULT NOTE  Pharmacy Consult for heparin Indication: chest pain/ACS  Allergies  Allergen Reactions   Percocet [Oxycodone-Acetaminophen] Anaphylaxis   Propoxyphene Swelling   Nsaids     Other reaction(s): Other (See Comments) Cannot take due to gastric bypass Cannot take due to gastric bypass     Patient Measurements: Height: 5' (152.4 cm) Weight: 95.7 kg (211 lb) IBW/kg (Calculated) : 45.5 Heparin Dosing Weight: 68.5kg  Vital Signs: Temp: 98.5 F (36.9 C) (10/14 0013) Temp Source: Oral (10/14 0013) BP: 150/96 (10/14 0455) Pulse Rate: 72 (10/14 0455)  Labs: Recent Labs    11/23/20 1618 11/23/20 1618 11/23/20 1808 11/24/20 0256 11/24/20 1039 11/24/20 1902 11/25/20 0447  HGB 11.4*  --   --  10.6*  --   --  11.9*  HCT 34.3*  --   --  32.1*  --   --  35.2*  PLT 335  --   --  328  --   --  343  HEPARINUNFRC  --    < >  --  0.25* 0.24* <0.10* 0.54  CREATININE 1.08*  --   --  0.94  --   --   --   TROPONINIHS 4  --  4 8  --   --   --    < > = values in this interval not displayed.     Estimated Creatinine Clearance: 68.4 mL/min (by C-G formula based on SCr of 0.94 mg/dL).   Medical History: Past Medical History:  Diagnosis Date   Diabetes mellitus    Hyperlipidemia    Hypertension    Morbid obesity (HCC)    Osteoarthritis, chronic    bilateral knees    Assessment: 53 YOF presenting with CP, hx of HLD, HTN, DM.  She is not on anticoagulation PTA, chronic anemia stable.  Heparin level therapeutic (0.54) on gtt at 1150 units/hr. No bleeding noted.  Goal of Therapy:  Heparin level 0.3-0.7 units/ml Monitor platelets by anticoagulation protocol: Yes   Plan:  Continue heparin at 1150 units/hr F/u 6 hr heparin level to confirm therapeutic  Christoper Fabian, PharmD, BCPS Please see amion for complete clinical pharmacist phone list 11/25/2020 5:40 AM

## 2020-12-03 ENCOUNTER — Encounter (HOSPITAL_BASED_OUTPATIENT_CLINIC_OR_DEPARTMENT_OTHER): Payer: Self-pay | Admitting: *Deleted

## 2020-12-03 ENCOUNTER — Other Ambulatory Visit: Payer: Self-pay

## 2020-12-03 ENCOUNTER — Emergency Department (HOSPITAL_BASED_OUTPATIENT_CLINIC_OR_DEPARTMENT_OTHER)
Admission: EM | Admit: 2020-12-03 | Discharge: 2020-12-03 | Disposition: A | Discharge status: Home / Self Care | Payer: BC Managed Care – PPO | Attending: Emergency Medicine | Admitting: Emergency Medicine

## 2020-12-03 DIAGNOSIS — I10 Essential (primary) hypertension: Secondary | ICD-10-CM | POA: Insufficient documentation

## 2020-12-03 DIAGNOSIS — R0981 Nasal congestion: Secondary | ICD-10-CM | POA: Insufficient documentation

## 2020-12-03 DIAGNOSIS — J029 Acute pharyngitis, unspecified: Secondary | ICD-10-CM | POA: Diagnosis not present

## 2020-12-03 DIAGNOSIS — E119 Type 2 diabetes mellitus without complications: Secondary | ICD-10-CM | POA: Diagnosis not present

## 2020-12-03 DIAGNOSIS — Z20822 Contact with and (suspected) exposure to covid-19: Secondary | ICD-10-CM | POA: Diagnosis not present

## 2020-12-03 LAB — RESP PANEL BY RT-PCR (FLU A&B, COVID) ARPGX2
Influenza A by PCR: NEGATIVE
Influenza B by PCR: NEGATIVE
SARS Coronavirus 2 by RT PCR: NEGATIVE

## 2020-12-03 LAB — GROUP A STREP BY PCR: Group A Strep by PCR: NOT DETECTED

## 2020-12-03 MED ORDER — DEXAMETHASONE SODIUM PHOSPHATE 10 MG/ML IJ SOLN
10.0000 mg | Freq: Once | INTRAMUSCULAR | Status: AC
  Administered 2020-12-03: 10 mg via INTRAMUSCULAR
  Filled 2020-12-03: qty 1

## 2020-12-03 NOTE — ED Triage Notes (Signed)
Pt c/o sore throat x 4 days and states tonight she woke up feeling sob and she could not breath
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
## 2020-12-03 NOTE — Discharge Instructions (Signed)
You were seen in the emergency department today with sore throat.  Your test did not show evidence of strep throat.  I have given you a shot of steroid to help reduce your symptoms.  You may take Tylenol and or Motrin as needed for pain unless you have been told to not take these in the past.  I am sending COVID and flu tests which will come back later this morning in the MyChart app.

## 2021-10-06 ENCOUNTER — Encounter (HOSPITAL_BASED_OUTPATIENT_CLINIC_OR_DEPARTMENT_OTHER): Payer: Self-pay | Admitting: Emergency Medicine

## 2021-10-06 ENCOUNTER — Emergency Department (HOSPITAL_BASED_OUTPATIENT_CLINIC_OR_DEPARTMENT_OTHER)
Admission: EM | Admit: 2021-10-06 | Discharge: 2021-10-06 | Disposition: A | Discharge status: Home / Self Care | Payer: BC Managed Care – PPO | Attending: Emergency Medicine | Admitting: Emergency Medicine

## 2021-10-06 ENCOUNTER — Other Ambulatory Visit: Payer: Self-pay

## 2021-10-06 DIAGNOSIS — Z79899 Other long term (current) drug therapy: Secondary | ICD-10-CM | POA: Insufficient documentation

## 2021-10-06 DIAGNOSIS — Z7982 Long term (current) use of aspirin: Secondary | ICD-10-CM | POA: Insufficient documentation

## 2021-10-06 DIAGNOSIS — R220 Localized swelling, mass and lump, head: Secondary | ICD-10-CM | POA: Insufficient documentation

## 2021-10-06 DIAGNOSIS — J029 Acute pharyngitis, unspecified: Secondary | ICD-10-CM

## 2021-10-06 MED ORDER — ALUM & MAG HYDROXIDE-SIMETH 200-200-20 MG/5ML PO SUSP
30.0000 mL | Freq: Once | ORAL | Status: AC
  Administered 2021-10-06: 30 mL via ORAL
  Filled 2021-10-06: qty 30

## 2021-10-06 NOTE — ED Triage Notes (Signed)
Pt states throat feels like it is dry and closing. Woke pt up about ago.

## 2021-10-06 NOTE — ED Provider Notes (Signed)
[facility] EMERGENCY DEPARTMENT Provider Note   CSN: [id number] Arrival date & time: 10/06/21  0342     History  Chief Complaint  Patient presents with   Oral Swelling    Faith [name] is a 56 y.o. female.  The history is provided by the patient.  Sore Throat This is a new problem. The current episode started less than 1 hour ago. The problem occurs constantly. The problem has not changed since onset.Pertinent negatives include no chest pain, no abdominal pain, no headaches and no shortness of breath. Associated symptoms comments: Ear pain . Nothing aggravates the symptoms. Nothing relieves the symptoms. She has tried nothing for the symptoms. The treatment provided no relief.       Home Medications Prior to Admission medications   Medication Sig Start Date End Date Taking? Authorizing Provider  amLODipine (NORVASC) 10 MG tablet Take 10 mg by mouth daily. 10/09/20   [provider]  aspirin EC 81 MG EC tablet Take 1 tablet (81 mg total) by mouth daily. Swallow whole. 11/26/20   Albertine Grates, MD  atorvastatin (LIPITOR) 40 MG tablet Take 1 tablet (40 mg total) by mouth daily. 11/25/20   Albertine Grates, MD  losartan (COZAAR) 50 MG tablet Take 1 tablet (50 mg total) by mouth daily. 11/25/20   Albertine Grates, MD  Misc. Devices (HUGO ROLLING WALKER) MISC 1 Device by Does not apply route once. 10/03/10   Lenda Kelp, MD  Multiple Vitamin (MULTIVITAMIN) tablet Take 1 tablet by mouth daily.    [provider]  nitroGLYCERIN (NITROSTAT) 0.4 MG SL tablet Place 1 tablet (0.4 mg total) under the tongue every 5 (five) minutes as needed for chest pain. 11/25/20   Albertine Grates, MD  Vitamin D, Ergocalciferol, (DRISDOL) 1.25 MG (50000 UNIT) CAPS capsule Take 50,000 Units by mouth once a week. Monday's 09/12/20   [provider]      Allergies    Percocet [oxycodone-acetaminophen], Propoxyphene, and Nsaids    Review of Systems   Review of Systems  Constitutional:  Negative  for fever.  HENT:  Positive for ear pain and sore throat. Negative for trouble swallowing and voice change.   Respiratory:  Negative for shortness of breath.   Cardiovascular:  Negative for chest pain.  Gastrointestinal:  Negative for abdominal pain.  Neurological:  Negative for headaches.  All other systems reviewed and are negative.   Physical Exam Updated Vital Signs BP (!) 173/98 (BP Location: Left Arm)   Pulse 66   Temp 98 F (36.7 C) (Oral)   Resp 18   Ht 5' (1.524 m)   Wt 88.5 kg   SpO2 100%   BMI 38.08 kg/m  Physical Exam Vitals and nursing note reviewed.  Constitutional:      General: She is not in acute distress.    Appearance: Normal appearance. She is well-developed.  HENT:     Head: Normocephalic and atraumatic.     Right Ear: Tympanic membrane and ear canal normal.     Left Ear: Tympanic membrane and ear canal normal.     Mouth/Throat:     Mouth: Mucous membranes are dry.     Pharynx: Oropharynx is clear. No oropharyngeal exudate.  Eyes:     Pupils: Pupils are equal, round, and reactive to light.  Neck:     Vascular: No carotid bruit.     Comments: Intact phonation no pain with displacement of the trachea  Cardiovascular:     Rate and Rhythm:  Normal rate and regular rhythm.     Pulses: Normal pulses.     Heart sounds: Normal heart sounds.  Pulmonary:     Effort: Pulmonary effort is normal. No respiratory distress.     Breath sounds: Normal breath sounds.  Abdominal:     General: Bowel sounds are normal. There is no distension.     Palpations: Abdomen is soft.     Tenderness: There is no abdominal tenderness. There is no guarding or rebound.  Genitourinary:    Vagina: No vaginal discharge.  Musculoskeletal:        General: Normal range of motion.     Cervical back: Normal range of motion and neck supple. No rigidity or tenderness.  Skin:    General: Skin is dry.     Capillary Refill: Capillary refill takes less than 2 seconds.     Findings: No  erythema or rash.  Neurological:     General: No focal deficit present.     Deep Tendon Reflexes: Reflexes normal.  Psychiatric:        Mood and Affect: Mood normal.     ED Results / Procedures / Treatments   Labs (all labs ordered are listed, but only abnormal results are displayed) Labs Reviewed - No data to display  EKG None  Radiology No results found.  Procedures Procedures    Medications Ordered in ED Medications  alum & mag hydroxide-simeth (MAALOX/MYLANTA) 200-200-20 MG/5ML suspension 30 mL (30 mLs Oral Given 10/06/21 0428)    ED Course/ Medical Decision Making/ A&P                           Medical Decision Making Sore throat x 30 minutes.  Felt like it was closing, but no SOB no swelling no rash   Risk OTC drugs. Risk Details: There is no swelling.  Intact phonation.  Throat is not red, there is no swelling.  NO lan.  Well appearing.  Patient refused covid swab.  GI cocktail given.  Stable for discharge.  Strict return     Final Clinical Impression(s) / ED Diagnoses Final diagnoses:  None   Return for intractable cough, coughing up blood, fevers > 100.4 unrelieved by medication, shortness of breath, intractable vomiting, chest pain, shortness of breath, weakness, numbness, changes in speech, facial asymmetry, abdominal pain, passing out, Inability to tolerate liquids or food, cough, altered mental status or any concerns. No signs of systemic illness or infection. The patient is nontoxic-appearing on exam and vital signs are within normal limits.  I have reviewed the triage vital signs and the nursing notes. Pertinent labs & imaging results that were available during my care of the patient were reviewed by me and considered in my medical decision making (see chart for details). After history, exam, and medical workup I feel the patient has been appropriately medically screened and is safe for discharge home. Pertinent diagnoses were discussed with the patient.  Patient was given return precautions.  Rx / DC Orders ED Discharge Orders     None         Hendrik Donath, MD 10/06/21 [phone]
